# Patient Record
Sex: Male | Born: 1990
Health system: Southern US, Community
[De-identification: ages and names within clinical notes are randomized; demographics above are authoritative.]

## PROBLEM LIST (undated history)

## (undated) DIAGNOSIS — K529 Noninfective gastroenteritis and colitis, unspecified: Secondary | ICD-10-CM

## (undated) DIAGNOSIS — R569 Unspecified convulsions: Secondary | ICD-10-CM

## (undated) DIAGNOSIS — D649 Anemia, unspecified: Secondary | ICD-10-CM

## (undated) DIAGNOSIS — D126 Benign neoplasm of colon, unspecified: Secondary | ICD-10-CM

## (undated) DIAGNOSIS — R519 Headache, unspecified: Secondary | ICD-10-CM

## (undated) HISTORY — PX: HERNIA REPAIR: SHX51

## (undated) HISTORY — PX: TONSILLECTOMY: SUR1361

---

## 2017-06-06 HISTORY — PX: LIPOMA EXCISION: SHX5283

## 2018-05-14 ENCOUNTER — Other Ambulatory Visit: Payer: Self-pay

## 2018-05-14 ENCOUNTER — Emergency Department
Admission: EM | Admit: 2018-05-14 | Discharge: 2018-05-14 | Disposition: A | Discharge status: Home / Self Care | Payer: Medicaid - Out of State | Attending: Emergency Medicine | Admitting: Emergency Medicine

## 2018-05-14 ENCOUNTER — Encounter: Payer: Self-pay | Admitting: Emergency Medicine

## 2018-05-14 DIAGNOSIS — R0981 Nasal congestion: Secondary | ICD-10-CM | POA: Diagnosis present

## 2018-05-14 DIAGNOSIS — J0100 Acute maxillary sinusitis, unspecified: Secondary | ICD-10-CM | POA: Insufficient documentation

## 2018-05-14 DIAGNOSIS — R070 Pain in throat: Secondary | ICD-10-CM | POA: Insufficient documentation

## 2018-05-14 DIAGNOSIS — R569 Unspecified convulsions: Secondary | ICD-10-CM

## 2018-05-14 HISTORY — DX: Unspecified convulsions: R56.9

## 2018-05-14 LAB — COMPREHENSIVE METABOLIC PANEL
ALT: 20 U/L (ref 0–44)
AST: 20 U/L (ref 15–41)
Albumin: 4.8 g/dL (ref 3.5–5.0)
Alkaline Phosphatase: 61 U/L (ref 38–126)
Anion gap: 7 (ref 5–15)
BUN: 14 mg/dL (ref 6–20)
CO2: 28 mmol/L (ref 22–32)
Calcium: 9.2 mg/dL (ref 8.9–10.3)
Chloride: 103 mmol/L (ref 98–111)
Creatinine, Ser: 1.11 mg/dL (ref 0.61–1.24)
GFR calc Af Amer: >60 mL/min (ref >60)
GFR calc non Af Amer: >60 mL/min (ref >60)
Glucose, Bld: 103 mg/dL — ABNORMAL HIGH (ref 70–99)
Potassium: 4.1 mmol/L (ref 3.5–5.1)
Sodium: 138 mmol/L (ref 135–145)
Total Bilirubin: 0.4 mg/dL (ref 0.3–1.2)
Total Protein: 7.5 g/dL (ref 6.5–8.1)

## 2018-05-14 LAB — URINALYSIS, COMPLETE (UACMP) WITH MICROSCOPIC
Bacteria, UA: NONE SEEN
Bilirubin Urine: NEGATIVE
Glucose, UA: NEGATIVE mg/dL
Hgb urine dipstick: NEGATIVE
Ketones, ur: NEGATIVE mg/dL
Leukocytes, UA: NEGATIVE
Nitrite: NEGATIVE
Protein, ur: NEGATIVE mg/dL
Specific Gravity, Urine: 1.008 (ref 1.005–1.030)
Squamous Epithelial / HPF: NONE SEEN (ref 0–5)
pH: 7 (ref 5.0–8.0)

## 2018-05-14 LAB — CBC
HCT: 43.4 % (ref 39.0–52.0)
Hemoglobin: 14.6 g/dL (ref 13.0–17.0)
MCH: 30.3 pg (ref 26.0–34.0)
MCHC: 33.6 g/dL (ref 30.0–36.0)
MCV: 90 fL (ref 80.0–100.0)
Platelets: 227 10*3/uL (ref 150–400)
RBC: 4.82 MIL/uL (ref 4.22–5.81)
RDW: 12.3 % (ref 11.5–15.5)
WBC: 7.8 10*3/uL (ref 4.0–10.5)
nRBC: 0 % (ref 0.0–0.2)

## 2018-05-14 LAB — URINE DRUG SCREEN, QUALITATIVE (ARMC ONLY)
Amphetamines, Ur Screen: NOT DETECTED
Barbiturates, Ur Screen: NOT DETECTED
Benzodiazepine, Ur Scrn: NOT DETECTED
Cannabinoid 50 Ng, Ur ~~LOC~~: NOT DETECTED
Cocaine Metabolite,Ur ~~LOC~~: NOT DETECTED
MDMA (Ecstasy)Ur Screen: NOT DETECTED
Methadone Scn, Ur: NOT DETECTED
Opiate, Ur Screen: NOT DETECTED
Phencyclidine (PCP) Ur S: NOT DETECTED
Tricyclic, Ur Screen: NOT DETECTED

## 2018-05-14 LAB — ETHANOL: Alcohol, Ethyl (B): <10 mg/dL (ref <10)

## 2018-05-14 LAB — MAGNESIUM: Magnesium: 2.4 mg/dL (ref 1.7–2.4)

## 2018-05-14 MED ORDER — AMOXICILLIN-POT CLAVULANATE 875-125 MG PO TABS: 1.0000 | ORAL_TABLET | Freq: Two times a day (BID) | ORAL | 0 refills | Status: AC

## 2018-05-14 NOTE — Discharge Instructions (Signed)
Your exam is consistent with a sinus infection.  Please follow-up with neurology for reevaluation of pseudoseizures.  Please return to the emergency department for any new, change or increase in symptoms.

## 2018-05-14 NOTE — ED Provider Notes (Signed)
[facility] Emergency Department Provider Note  ____________________________________________  Time seen: Approximately 8:43 PM  I have reviewed the triage vital signs and the nursing notes.   HISTORY  Chief Complaint Nasal Congestion and URI    HPI Tony [name] is a 27 y.o. male that presents emergency department for evaluation of nasal congestion, sore throat for 1 week.  Patient states also while waiting in the room, he began to have a seizure.  He states that his whole body feels numb and both his arms are shaking.  He states that he was diagnosed 2 months ago with pseudoseizures.  He has had an MRI and a normal EEG.  His neurologist put him on carbamazepine and he self continued 1 month ago because it did not help.  He was evaluated in the emergency department several times for this in [place] and was given Ativan, with relief.  This feels the same as his normal episodes.  He estimates having about 3/week and states they last about 5 minutes.  He just moved here from [place] and does not yet have follow-up with a neurologist or primary care.  No fever, chills.  Past Medical History:  Diagnosis Date  . Seizures ([place])     There are no active problems to display for this patient.   Past Surgical History:  Procedure Laterality Date  . TONSILLECTOMY      Prior to Admission medications   Not on File    Allergies Patient has no known allergies.  History reviewed. No pertinent family history.  Social History Social History   Tobacco Use  . Smoking status: Never Smoker  . Smokeless tobacco: Never Used  Substance Use Topics  . Alcohol use: Not on file    Comment: rarely  . Drug use: Never     Review of Systems  Constitutional: No fever/chills Eyes: No visual changes. No discharge. ENT: Positive for congestion and rhinorrhea. Cardiovascular: No chest pain. Respiratory: Negative for cough. No SOB.  Gastrointestinal: No abdominal pain.  No  nausea, no vomiting.   Musculoskeletal: Negative for musculoskeletal pain. Skin: Negative for rash, abrasions, lacerations, ecchymosis. Neurological: Negative for headaches.   ____________________________________________   PHYSICAL EXAM:  VITAL SIGNS: ED Triage Vitals [05/14/18 1929]  Enc Vitals Group     BP 108/67     Pulse Rate 69     Resp 16     Temp 98.6 F (37 C)     Temp Source Oral     SpO2 98 %     Weight 200 lb (90.7 kg)     Height 5\' 10"  (1.778 m)     Head Circumference      Peak Flow      Pain Score 3     Pain Loc      Pain Edu?      Excl. in [place]?      Constitutional: Alert and oriented. Well appearing and in no acute distress. Eyes: Conjunctivae are normal. PERRL. EOMI. No discharge. Head: Atraumatic. ENT: No frontal and maxillary sinus tenderness.      Ears: Tympanic membranes pearly gray with good landmarks. No discharge.      Nose: Mild congestion/rhinnorhea.      Mouth/Throat: Mucous membranes are moist. Oropharynx non-erythematous. Tonsils not enlarged. No exudates. Uvula midline. Neck: No stridor.   Hematological/Lymphatic/Immunilogical: No cervical lymphadenopathy. Cardiovascular: Normal rate, regular rhythm.  Good peripheral circulation. Respiratory: Normal respiratory effort without tachypnea or retractions. Lungs CTAB. Good air entry to the bases  with no decreased or absent breath sounds. Gastrointestinal: Bowel sounds 4 quadrants. Soft and nontender to palpation. No guarding or rigidity. No palpable masses. No distention. Musculoskeletal: Full range of motion to all extremities. No gross deformities appreciated. Neurologic: Normal speech and language. No gross focal neurologic deficits are appreciated.   Strength 5/5 in upper and lower extremities Cerebellar: Finger-nose-finger slow, Heel to shin WNL Sensorimotor: No pronator drift, clonus, sensory loss or abnormal reflexes. No vision deficits noted to confrontation bilaterally.  Speech: No  dysarthria or expressive aphasia Skin:  Skin is warm, dry and intact. No rash noted. Psychiatric: Mood and affect are normal. Speech and behavior are normal. Patient exhibits appropriate insight and judgement.   ____________________________________________   LABS (all labs ordered are listed, but only abnormal results are displayed)  Labs Reviewed  CBC  COMPREHENSIVE METABOLIC PANEL  MAGNESIUM  ETHANOL  URINALYSIS, COMPLETE (UACMP) WITH MICROSCOPIC  URINE DRUG SCREEN, QUALITATIVE ([place])   ____________________________________________  EKG   ____________________________________________  RADIOLOGY   No results found.  ____________________________________________    PROCEDURES  Procedure(s) performed:    Procedures    Medications - No data to display   ____________________________________________   INITIAL IMPRESSION / ASSESSMENT AND PLAN / ED COURSE  Pertinent labs & imaging results that were available during my care of the patient were reviewed by me and considered in my medical decision making (see chart for details).  Review of the [place] CSRS was performed in accordance of the [place] prior to dispensing any controlled drugs.     Patient presented emergency department for evaluation of nasal congestion and concern for seizure-like activity while in the emergency department.  He was diagnosed in [place] with pseudoseizures 2 months ago and has not had follow-up with neurology or primary care here.  Initial neuro exam was remarkable for slow finger-to-nose.  Patient did have visible tremors to bilateral arms, which stopped when he was readjusting himself in bed.  He also felt subjectively numb to his entire body.  He estimated that this episode lasted 5 minutes.  Repeat neuro exam was normal.  Blood work is within normal limits.  Dr. Cherylann Banas came to see the patient and agrees with plan of discharge home and follow-up with neurology.  Patient appears well and  is staying well hydrated. Patient feels comfortable going home. Patient will be discharged home with prescriptions for Augmentin. Patient is to follow up with neurology and primary care as needed or otherwise directed. Patient is given ED precautions to return to the ED for any worsening or new symptoms.     ____________________________________________  FINAL CLINICAL IMPRESSION(S) / ED DIAGNOSES  Final diagnoses:  None      NEW MEDICATIONS STARTED DURING THIS VISIT:  ED Discharge Orders    None          This chart was dictated using voice recognition software/Dragon. Despite best efforts to proofread, errors can occur which can change the meaning. Any change was purely unintentional.    Laban Emperor, PA-C 05/15/18 0029    Arta Silence, MD 05/15/18 [phone]

## 2018-05-14 NOTE — ED Triage Notes (Signed)
Pt to ED from home c/o URI, sore throat over 1 week.  Pt appears in NAD at this time.

## 2018-06-28 ENCOUNTER — Emergency Department
Admission: EM | Admit: 2018-06-28 | Discharge: 2018-06-28 | Disposition: A | Discharge status: Home / Self Care | Payer: Medicaid - Out of State | Attending: Emergency Medicine | Admitting: Emergency Medicine

## 2018-06-28 DIAGNOSIS — I951 Orthostatic hypotension: Secondary | ICD-10-CM | POA: Diagnosis not present

## 2018-06-28 DIAGNOSIS — E860 Dehydration: Secondary | ICD-10-CM | POA: Insufficient documentation

## 2018-06-28 DIAGNOSIS — R42 Dizziness and giddiness: Secondary | ICD-10-CM

## 2018-06-28 DIAGNOSIS — R55 Syncope and collapse: Secondary | ICD-10-CM | POA: Diagnosis present

## 2018-06-28 LAB — CBC
HCT: 41.7 % (ref 39.0–52.0)
Hemoglobin: 14.1 g/dL (ref 13.0–17.0)
MCH: 29.8 pg (ref 26.0–34.0)
MCHC: 33.8 g/dL (ref 30.0–36.0)
MCV: 88.2 fL (ref 80.0–100.0)
Platelets: 222 10*3/uL (ref 150–400)
RBC: 4.73 MIL/uL (ref 4.22–5.81)
RDW: 12.3 % (ref 11.5–15.5)
WBC: 15.4 10*3/uL — ABNORMAL HIGH (ref 4.0–10.5)
nRBC: 0 % (ref 0.0–0.2)

## 2018-06-28 LAB — GLUCOSE, CAPILLARY: Glucose-Capillary: 120 mg/dL — ABNORMAL HIGH (ref 70–99)

## 2018-06-28 LAB — BASIC METABOLIC PANEL
Anion gap: 7 (ref 5–15)
BUN: 22 mg/dL — ABNORMAL HIGH (ref 6–20)
CO2: 26 mmol/L (ref 22–32)
Calcium: 9 mg/dL (ref 8.9–10.3)
Chloride: 105 mmol/L (ref 98–111)
Creatinine, Ser: 1.2 mg/dL (ref 0.61–1.24)
GFR calc Af Amer: >60 mL/min (ref >60)
GFR calc non Af Amer: >60 mL/min (ref >60)
Glucose, Bld: 95 mg/dL (ref 70–99)
Potassium: 4 mmol/L (ref 3.5–5.1)
Sodium: 138 mmol/L (ref 135–145)

## 2018-06-28 LAB — INFLUENZA PANEL BY PCR (TYPE A & B)
Influenza A By PCR: NEGATIVE
Influenza B By PCR: NEGATIVE

## 2018-06-28 MED ORDER — ONDANSETRON 4 MG PO TBDP: 4.0000 mg | ORAL_TABLET | Freq: Three times a day (TID) | ORAL | 0 refills | Status: DC | PRN

## 2018-06-28 NOTE — ED Provider Notes (Signed)
[facility] Emergency Department Provider Note  ____________________________________________  Time seen: Approximately 10:06 PM  I have reviewed the triage vital signs and the nursing notes.   HISTORY  Chief Complaint Loss of Consciousness    HPI Tony [name] is a 28 y.o. 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  He tried to hold it as long as he could but then when he felt like he really needed to go he got up suddenly got to go to the bathroom and quickly felt like his body went numb, vision went dark, and fell down.  Unsure if he lost consciousness.  He went to the bathroom, but on trying to walk back from the bathroom he fell 2 more times when he tried to stand up and walk.  Denies any chest pain shortness of breath headache vision change paresthesias or weakness.  No significant prodromal symptoms or current pain complaints.  He just feels tired.  Does note that he had not had anything to eat today although he had been drinking some water.  No recent illness.      Past Medical History:  Diagnosis Date  . Seizures ([place])      There are no active problems to display for this patient.    Past Surgical History:  Procedure Laterality Date  . TONSILLECTOMY       Prior to Admission medications   Medication Sig Start Date End Date Taking? Authorizing Provider  ondansetron (ZOFRAN ODT) 4 MG disintegrating tablet Take 1 tablet (4 mg total) by mouth every 8 (eight) hours as needed for nausea or vomiting. 06/28/18   Carrie Mew, MD     Allergies Patient has no known allergies.   No family history on file.  Social History Social History   Tobacco Use  . Smoking status: Never Smoker  . Smokeless tobacco: Never Used  Substance Use Topics  . Alcohol use: Not on file    Comment: rarely  . Drug use: Never    Review of Systems  Constitutional:   No fever  or chills.  ENT:   No sore throat. No rhinorrhea. Cardiovascular:   No chest pain or syncope. Respiratory:   No dyspnea or cough. Gastrointestinal:   Negative for abdominal pain, vomiting and diarrhea.  Musculoskeletal:   Negative for focal pain or swelling All other systems reviewed and are negative except as documented above in ROS and HPI.  ____________________________________________   PHYSICAL EXAM:  VITAL SIGNS: ED Triage Vitals [06/28/18 2024]  Enc Vitals Group     BP (!) 110/96     Pulse Rate 82     Resp 18     Temp 97.9 F (36.6 C)     Temp Source Oral     SpO2 97 %     Weight 200 lb (90.7 kg)     Height 5\' 10"  (1.778 m)     Head Circumference      Peak Flow      Pain Score 5     Pain Loc      Pain Edu?      Excl. in [place]?     Vital signs reviewed, nursing assessments reviewed.   Constitutional:   Alert and oriented. Non-toxic appearance. Eyes:   Conjunctivae are normal. EOMI. PERRL. ENT      Head:   Normocephalic and atraumatic.      Nose:  No congestion/rhinnorhea.       Mouth/Throat:   MMM, no pharyngeal erythema. No peritonsillar mass.       Neck:   No meningismus. Full ROM. Hematological/Lymphatic/Immunilogical:   No cervical lymphadenopathy. Cardiovascular:   RRR. Symmetric bilateral radial and DP pulses.  No murmurs. Cap refill less than 2 seconds.  Heart rate increases from 70-100 moving from supine to sitting.  1+ radial pulse sitting upright.  Normal capillary refill Respiratory:   Normal respiratory effort without tachypnea/retractions. Breath sounds are clear and equal bilaterally. No wheezes/rales/rhonchi. Gastrointestinal:   Soft and nontender. Non distended. There is no CVA tenderness.  No rebound, rigidity, or guarding.  Musculoskeletal:   Normal range of motion in all extremities. No joint effusions.  No lower extremity tenderness.  No edema. Neurologic:   Normal speech and language.  Motor grossly intact. Ambulatory. No acute focal  neurologic deficits are appreciated.  Skin:    Skin is warm, dry and intact. No rash noted.  No petechiae, purpura, or bullae.  ____________________________________________    LABS (pertinent positives/negatives) (all labs ordered are listed, but only abnormal results are displayed) Labs Reviewed  BASIC METABOLIC PANEL - Abnormal; Notable for the following components:      Result Value   BUN 22 (*)    All other components within normal limits  CBC - Abnormal; Notable for the following components:   WBC 15.4 (*)    All other components within normal limits  GLUCOSE, CAPILLARY - Abnormal; Notable for the following components:   Glucose-Capillary 120 (*)    All other components within normal limits  INFLUENZA PANEL BY PCR (TYPE A & B)  URINALYSIS, COMPLETE (UACMP) WITH MICROSCOPIC  CBG MONITORING, ED   ____________________________________________   EKG  Interpreted by me  Date: 06/28/2018  Rate: 83  Rhythm: normal sinus rhythm  QRS Axis: normal  Intervals: normal  ST/T Wave abnormalities: normal  Conduction Disutrbances: none  Narrative Interpretation: unremarkable      ____________________________________________    RADIOLOGY  No results found.  ____________________________________________   PROCEDURES Procedures  ____________________________________________    CLINICAL IMPRESSION / ASSESSMENT AND PLAN / ED COURSE  Pertinent labs & imaging results that were available during my care of the patient were reviewed by me and considered in my medical decision making (see chart for details).    Patient presents with orthostatic symptoms, recurrent falls which appear to be due to orthostatic hypotension.  On exam he has a significant increase in heart rate from supine to sitting.  Work-up unremarkable, doubt cardiovascular cause.  Recommend aggressive hydration with carbohydrate salt and water intake.  Follow-up with primary care within a week.  Follow-up  information given for GI as well as he does report a strong family history of IBD, although he currently does not have symptoms such as chronic diarrhea abdominal pain bloody stool or weight loss.      ____________________________________________   FINAL CLINICAL IMPRESSION(S) / ED DIAGNOSES    Final diagnoses:  Orthostatic dizziness  Dehydration     ED Discharge Orders         Ordered    ondansetron (ZOFRAN ODT) 4 MG disintegrating tablet  Every 8 hours PRN     06/28/18 2205          Portions of this note were generated with dragon dictation software. Dictation errors may occur despite best attempts at proofreading.   Carrie Mew, MD 06/28/18 2210

## 2018-06-28 NOTE — ED Triage Notes (Signed)
Patient reports LOC earlier today. Patient reports hx of seizure-like activity, seeing neurology for. Patient denies any other symptoms.

## 2018-06-28 NOTE — ED Notes (Signed)
Pt signed printed d/c paperwork.  

## 2018-06-28 NOTE — ED Notes (Signed)
Pt denies hitting head when falling down but makes statement of HA. Pupils round, equal, reactive to light.

## 2018-06-28 NOTE — Discharge Instructions (Signed)
Your lab tests today were okay, and your flu test was negative.

## 2018-07-26 ENCOUNTER — Ambulatory Visit (INDEPENDENT_AMBULATORY_CARE_PROVIDER_SITE_OTHER)
Admit: 2018-07-26 | Discharge: 2018-07-26 | Disposition: A | Discharge status: Home / Self Care | Payer: Worker's Compensation | Attending: Emergency Medicine | Admitting: Emergency Medicine

## 2018-07-26 ENCOUNTER — Other Ambulatory Visit: Payer: Self-pay

## 2018-07-26 ENCOUNTER — Ambulatory Visit
Admission: EM | Admit: 2018-07-26 | Discharge: 2018-07-26 | Disposition: A | Discharge status: Home / Self Care | Payer: Self-pay | Attending: Emergency Medicine | Admitting: Emergency Medicine

## 2018-07-26 DIAGNOSIS — R51 Headache: Secondary | ICD-10-CM

## 2018-07-26 DIAGNOSIS — S0990XA Unspecified injury of head, initial encounter: Secondary | ICD-10-CM

## 2018-07-26 DIAGNOSIS — H6122 Impacted cerumen, left ear: Secondary | ICD-10-CM

## 2018-07-26 DIAGNOSIS — S060X0A Concussion without loss of consciousness, initial encounter: Secondary | ICD-10-CM

## 2018-07-26 NOTE — Discharge Instructions (Addendum)
You need to follow-up with occupational health at [facility].  You need to follow-up within a day or 2.  You may need to be referred to a concussion clinic.  You may take 1 g of Tylenol 3 or 4 times a day as needed for pain.  Go Immediately to the ER if you get worse, vomiting, visual loss, or for any concerns.

## 2018-07-26 NOTE — ED Provider Notes (Addendum)
HPI  SUBJECTIVE:  Tony [name] is a 28 y.o. male who presents with left-sided head and facial pain, headache, constantly blurry vision after being hit in the left temple with a metal bar while changing a tire yesterday while at work.  Patient states that the bar popped and hit him in the side of the head.  He reports nausea, and states that "everything is blurry".  He describes the head and facial pain as constant, dull, becoming sharp with movement, opening jaw and palpation.  He reports mild photophobia, cognitive slowing, difficulty concentrating and states that he slept poorly last night due to the pain.  No nasal congestion, rhinorrhea, eye trauma, eye pain.  No emotional lability.  He also reports an episode where his "body went numb" for about 10 to 15 minutes followed by 1 to 2 hours of shaking.  Denies loss of consciousness, falling, biting his tongue, urinary, fecal incontinence.  Tried ibuprofen with improvement in his headache/facial pain and rest.  Symptoms are worse with moving his head, opening his jaw, palpation.  He denies loss of consciousness, retrograde or anterograde amnesia, vomiting, neck stiffness, slurred speech, arm or leg weakness, facial droop.  No visual loss.  No eye trauma.  He has a past medical history of multiple concussions, he has never sought medical evaluation for any of them.  He estimates over 10 concussions.  He is not on any anticoagulants or antiplatelets.  No history of diabetes, hypertension.  History of anxiety, and has a negative work-up in the past for seizures.  PMD: None.  This is a workers comp case.    Past Medical History:  Diagnosis Date  . Seizures ([place])     Past Surgical History:  Procedure Laterality Date  . TONSILLECTOMY      History reviewed. No pertinent family history.  Social History   Tobacco Use  . Smoking status: Never Smoker  . Smokeless tobacco: Never Used  Substance Use Topics  . Alcohol use: Yes    Comment: rarely  .  Drug use: Never    No current facility-administered medications for this encounter.   Current Outpatient Medications:  .  ondansetron (ZOFRAN ODT) 4 MG disintegrating tablet, Take 1 tablet (4 mg total) by mouth every 8 (eight) hours as needed for nausea or vomiting., Disp: 20 tablet, Rfl: 0  No Known Allergies   ROS  As noted in HPI.   Physical Exam  BP (!) 124/96 (BP Location: Left Arm)   Pulse 61   Temp 97.6 F (36.4 C) (Oral)   Resp 18   Ht 5\' 10"  (1.778 m)   Wt 93 kg   SpO2 100%   BMI 29.41 kg/m   Constitutional: Well developed, well nourished, no acute distress Eyes: PERRLA, EOMI, conjunctiva normal bilaterally. Mild direct and consensual photophobia.  No corneal abrasion seen on fluorescein exam.  No hyphema.  Visual fields intact to confrontation Visual acuity: 20/40 right eye, 20/25 left eye.  20/25 bilaterally. HENT: Normocephalic, atraumatic.  tenderness superior left orbit and along the left temple.  No crepitus, bruising.  Skin intact.  TMs obscured with cerumen bilaterally.  No dental trauma.  No tenderness over the zygoma or the rest of the left face.  No tenderness, crepitus at the TMJ. Post irrigation of the left ear, no hemotympanum. Neck: Positive left-sided trapezial tenderness.  No appreciable muscle spasm.  No C-spine tenderness. Respiratory: Normal inspiratory effort Cardiovascular: Normal rate GI: nondistended skin: No rash, skin intact Musculoskeletal: no deformities Neurologic:  Alert & oriented, finger-nose within normal limits.  Tandem gait steady.  Romberg negative.  Cranial nerves III through XII intact.  Poor concentration.  Short-term recall 1 out of 3 items. Psychiatric: Speech and behavior appropriate   ED Course   Medications - No data to display  Orders Placed This Encounter  Procedures  . CT Head Wo Contrast    Standing Status:   Standing    Number of Occurrences:   1  . Ear wax removal    left    Standing Status:   Standing     Number of Occurrences:   1  . Visual acuity screening    Standing Status:   Standing    Number of Occurrences:   1    No results found for this or any previous visit (from the past 24 hour(s)). Ct Head Wo Contrast  Result Date: 07/26/2018 CLINICAL DATA:  28 y/o M; left-sided head injury at work yesterday. Visual changes and dizziness. EXAM: CT HEAD WITHOUT CONTRAST TECHNIQUE: Contiguous axial images were obtained from the base of the skull through the vertex without intravenous contrast. COMPARISON:  None. FINDINGS: Brain: No evidence of acute infarction, hemorrhage, hydrocephalus, extra-axial collection or mass lesion/mass effect. Vascular: No hyperdense vessel or unexpected calcification. Skull: Normal. Negative for fracture or focal lesion. Sinuses/Orbits: Moderate ethmoid air cell mucosal thickening and opacification of the visible right maxillary sinus. Normal aeration of mastoid air cells. Debris within the right external auditory canal, likely cerumen. Orbits are unremarkable. Other: Small occipital region dermal nodules, likely dermal appendage cyst. IMPRESSION: 1. No acute intracranial abnormality or calvarial fracture. Unremarkable CT of the brain. 2. Paranasal sinus disease. Electronically Signed   By: Kristine Garbe M.D.   On: 07/26/2018 14:09    ED Clinical Impression  Concussion without loss of consciousness, initial encounter  Minor head injury, initial encounter  Impacted cerumen of left ear   ED Assessment/Plan  Patient presents with a concussion, posttraumatic headache.  Will CT head to rule out any intracranial abnormalities, orbital fracture.  No evidence of entrapment.  Will re-evaluate  Attempted to curette ear, unable to visualize TM.  We will have this irrigated and reevaluate.  Reviewed imaging independently.  No acute intracranial abnormality or calvarial fracture.  See radiology report for full details.  Will send home with Tylenol 1 g 4 times a day.   Follow-up with [facility] health employee health and wellness center at [facility] for reevaluation and possible referral to the concussion clinic. Will keep him out of work for today and tomorrow.  He is to follow-up with occupational health tomorrow.  He is to go to ER if he gets worse.  Discussed imaging, MDM, treatment plan, and plan for follow-up with patient. Discussed sn/sx that should prompt return to the ED. patient agrees with plan.   No orders of the defined types were placed in this encounter.   *This clinic note was created using Dragon dictation software. Therefore, there may be occasional mistakes despite careful proofreading.   ?   Melynda Ripple, MD 07/26/18 [place], Nickey Kloepfer, MD 07/26/18 [phone]

## 2018-07-26 NOTE — ED Triage Notes (Signed)
Patient states that yesterday at work he was changing a tire at work and a metal bar came across and hit him in the head. Patient states that he went home yesterday evening and thought everything was okay. Patient states that he has noticed some visual changes today and felt dizzy this morning. Patient concerned he may have a concussion.

## 2019-02-08 ENCOUNTER — Ambulatory Visit: Payer: 59 | Admitting: Internal Medicine

## 2019-02-08 ENCOUNTER — Other Ambulatory Visit: Payer: Self-pay

## 2019-02-08 ENCOUNTER — Encounter: Payer: Self-pay | Admitting: Internal Medicine

## 2019-02-08 VITALS — BP 108/78 | HR 79 | Ht 70.0 in | Wt 213.0 lb

## 2019-02-08 DIAGNOSIS — R569 Unspecified convulsions: Secondary | ICD-10-CM

## 2019-02-08 DIAGNOSIS — M62830 Muscle spasm of back: Secondary | ICD-10-CM | POA: Diagnosis not present

## 2019-02-08 DIAGNOSIS — K529 Noninfective gastroenteritis and colitis, unspecified: Secondary | ICD-10-CM | POA: Insufficient documentation

## 2019-02-08 DIAGNOSIS — F3289 Other specified depressive episodes: Secondary | ICD-10-CM

## 2019-02-08 DIAGNOSIS — R1084 Generalized abdominal pain: Secondary | ICD-10-CM

## 2019-02-08 DIAGNOSIS — F329 Major depressive disorder, single episode, unspecified: Secondary | ICD-10-CM | POA: Insufficient documentation

## 2019-02-08 DIAGNOSIS — F32A Depression, unspecified: Secondary | ICD-10-CM | POA: Insufficient documentation

## 2019-02-08 MED ORDER — METHOCARBAMOL 500 MG PO TABS: 500.0000 mg | ORAL_TABLET | Freq: Four times a day (QID) | ORAL | 1 refills | Status: DC

## 2019-02-08 NOTE — Progress Notes (Signed)
Date:  02/08/2019   Name:  Tony [name]   DOB:  1991/02/16   MRN:  [id number]   Chief Complaint: Establish Care (Is in the [facility], has not had pcp in a while. ), Depression (PHQ9- 11 GAD7- 11 ), Back Pain (Back pain x1 month- lower back. Wears a back brace from [employer]- helps but still painful. 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 X 1-2 years. Sometimes dark black stools. )  Depression        This is a chronic problem.  Episode onset: many years ago. The problem is unchanged.  Associated symptoms include decreased concentration, fatigue, insomnia and decreased interest.  Associated symptoms include not irritable, no myalgias, no headaches, not sad and no suicidal ideas.     The symptoms are aggravated by nothing.  Treatments tried: he has been on medications (unknown names) briefly in the past.  Compliance with treatment is poor.  Past compliance problems: did not feel that medications helped. Back Pain This is a new problem. The current episode started 1 to 4 weeks ago (started at work lifting heavy tires ). The problem occurs daily. The pain is present in the lumbar spine. The pain is mild. The symptoms are aggravated by standing and twisting. Associated symptoms include abdominal pain and weakness (left leg buckled under him recently). Pertinent negatives include no bowel incontinence, chest pain, headaches, numbness, paresthesias, perianal numbness, tingling or weight loss. Treatments tried: tylenol. The treatment provided mild relief.  Abdominal Pain This is a recurrent problem. The current episode started more than 1 year ago. The problem occurs daily. The problem has been unchanged. The pain is mild. The quality of the pain is a sensation of fullness. The abdominal pain radiates to the back. Associated symptoms include nausea. Pertinent negatives include no headaches, hematuria, myalgias, vomiting or weight loss. The  pain is aggravated by eating. He has tried antacids for the symptoms. The treatment provided mild relief.  Seizure like activity - he has had this for some time.  He was seen last year by Neurology, EMG was normal. Etiology of these episodes was never determined.  He was placed on medications with no benefit so he stopped them on his own. They seem to be brought on by stress.  He does not know the name of the neurologist that he saw in [place].  Review of Systems  Constitutional: Positive for fatigue. Negative for chills, diaphoresis, unexpected weight change and weight loss.  HENT: Negative for trouble swallowing.   Respiratory: Positive for shortness of breath (he feels like it is harder to breath here in [place].). Negative for chest tightness, wheezing and stridor.   Cardiovascular: Negative for chest pain, palpitations and leg swelling.  Gastrointestinal: Positive for abdominal pain and nausea. Negative for bowel incontinence and vomiting.  Genitourinary: Negative for difficulty urinating, hematuria and urgency.  Musculoskeletal: Positive for back pain. Negative for gait problem, joint swelling and myalgias.  Skin: Negative for rash.  Neurological: Positive for weakness (left leg buckled under him recently). Negative for dizziness, tingling, numbness, headaches and paresthesias.  Hematological: Negative for adenopathy. Does not bruise/bleed easily.  Psychiatric/Behavioral: Positive for decreased concentration, depression, dysphoric mood and sleep disturbance. Negative for suicidal ideas. The patient has insomnia. The patient is not nervous/anxious.     There are no active problems to display for this patient.   No Known Allergies  Past Surgical History:  Procedure Laterality Date  . LIPOMA  EXCISION  2019   lower right back  . TONSILLECTOMY      Social History   Tobacco Use  . Smoking status: Never Smoker  . Smokeless tobacco: Never Used  Substance Use Topics  . Alcohol use: Yes     Comment: rarely  . Drug use: Never     Medication list has been reviewed and updated.  No outpatient medications have been marked as taking for the 02/08/19 encounter (Office Visit) with Glean Hess, MD.    [facility] 2/9 Scores 02/08/2019  PHQ - 2 Score 1  PHQ- 9 Score 11    BP Readings from Last 3 Encounters:  02/08/19 108/78  07/26/18 (!) 124/96  06/28/18 112/71    Physical Exam Vitals signs and nursing note reviewed.  Constitutional:      General: He is not irritable.He is not in acute distress.    Appearance: He is well-developed.  HENT:     Head: Normocephalic and atraumatic.  Cardiovascular:     Rate and Rhythm: Normal rate and regular rhythm.     Heart sounds: Normal heart sounds. No murmur.  Pulmonary:     Effort: Pulmonary effort is normal. No respiratory distress.  Abdominal:     General: Abdomen is flat. Bowel sounds are normal. There is no distension.     Palpations: Abdomen is soft. There is no hepatomegaly or splenomegaly.     Tenderness: There is no abdominal tenderness. There is no guarding or rebound.  Musculoskeletal: Normal range of motion.     Lumbar back: He exhibits tenderness and spasm.  Skin:    General: Skin is warm and dry.     Capillary Refill: Capillary refill takes less than 2 seconds.     Findings: No rash.  Neurological:     Mental Status: He is alert and oriented to person, place, and time.     Sensory: Sensation is intact.     Motor: Motor function is intact.     Deep Tendon Reflexes:     Reflex Scores:      Patellar reflexes are 2+ on the right side and 2+ on the left side.      Achilles reflexes are 3+ on the right side and 3+ on the left side.    Comments: SLR mildly positive on the right  Psychiatric:        Attention and Perception: Attention and perception normal.        Mood and Affect: Mood normal.        Behavior: Behavior normal.        Thought Content: Thought content normal. Thought content does not include suicidal  ideation. Thought content does not include suicidal plan.        Cognition and Memory: Cognition normal.        Judgment: Judgment normal.     Wt Readings from Last 3 Encounters:  02/08/19 213 lb (96.6 kg)  07/26/18 205 lb (93 kg)  06/28/18 200 lb (90.7 kg)    BP 108/78   Pulse 79   Ht 5\' 10"  (1.778 m)   Wt 213 lb (96.6 kg)   SpO2 98%   BMI 30.56 kg/m   Assessment and Plan: 1. Seizure-like activity ([place]) Partial workup and treatment in [place] Needs to establish with Neurology here - Ambulatory referral to Neurology  2. Generalized abdominal pain No red flags signs but with family hx Crohn's he needs GI evaluation - Ambulatory referral to Gastroenterology  3. Back muscle spasm Continue  tylenol qid Use heat or ice Take robaxin when not driving or working - mainly at [employer] if no improvement - methocarbamol (ROBAXIN) 500 MG tablet; Take 1 tablet (500 mg total) by mouth 4 (four) times daily.  Dispense: 40 tablet; Refill: 1  4. Other depression Pt has long standing depression and PTSD by his report He is not interested in medication or psych referral at this time He can return here as needed Will plan for routine follow up in 4 months   Partially dictated using [place]. Any errors are unintentional.  Halina Maidens, MD [facility]  02/08/2019

## 2019-02-08 NOTE — Patient Instructions (Signed)
Continue Tylenol as needed for back pain  Use ice or heat your back

## 2019-02-26 ENCOUNTER — Ambulatory Visit (INDEPENDENT_AMBULATORY_CARE_PROVIDER_SITE_OTHER): Payer: 59 | Admitting: Gastroenterology

## 2019-02-26 ENCOUNTER — Encounter: Payer: Self-pay | Admitting: Gastroenterology

## 2019-02-26 ENCOUNTER — Other Ambulatory Visit: Payer: Self-pay

## 2019-02-26 VITALS — BP 98/61 | HR 58 | Temp 97.4°F | Ht 70.0 in | Wt 212.0 lb

## 2019-02-26 DIAGNOSIS — R194 Change in bowel habit: Secondary | ICD-10-CM

## 2019-02-26 DIAGNOSIS — R1084 Generalized abdominal pain: Secondary | ICD-10-CM

## 2019-02-26 MED ORDER — PEG 3350-KCL-NA BICARB-NACL 420 G PO SOLR: ORAL | 0 refills | Status: DC

## 2019-02-26 NOTE — Progress Notes (Signed)
Gastroenterology Consultation  Referring Provider:     Glean Hess, MD Primary Care Physician:  Tony Hess, MD Primary Gastroenterologist:  Dr. Allen [name]     Reason for Consultation:     Abdominal pain        HPI:   Tony [name] is a 28 y.o. y/o male referred for consultation & management of abdominal pain by Dr. Army [name], Tony Sans, MD.  This patient comes in today with a history of abdominal pain for the last 1 to 2 years.  The patient also reports that he has been having increased bowel movements for some time.  He reports that he has 3 soft bowel movements a day.  The patient's mother suffers from Crohn's disease and the patient states that he has chronic lower abdominal pain.  There is no report of any black stools or bloody stools.  He also denies any unexplained weight loss.  The patient does report that he does have dry eyes and joint pains.  He reports his joint pain to be an all joints including his hands feet and ankle.  He says he has shortness of breath because he moved from [place] and he says that the change in the altitude has caused him to have some trouble breathing.  The patient has not been diagnosed with inflammatory bowel disease in the past and he states that he has not followed up with any gastroenterologist to this abdominal pain in the past.  His symptoms are not made better or worse with eating or moving but he does report that he feels better after moving his bowels.  Past Medical History:  Diagnosis Date  . Seizures ([place])     Past Surgical History:  Procedure Laterality Date  . LIPOMA EXCISION  2019   lower right back  . TONSILLECTOMY      Prior to Admission medications   Medication Sig Start Date End Date Taking? Authorizing Provider  methocarbamol (ROBAXIN) 500 MG tablet Take 1 tablet (500 mg total) by mouth 4 (four) times daily. 02/08/19  Yes Tony Hess, MD  polyethylene glycol-electrolytes (NULYTELY/GOLYTELY) 420 g solution Drink one 8 oz  glass every 20 mins until entire container if finished starting at 5:00pm on 03/07/19 02/26/19   Tony Lame, MD    Family History  Problem Relation Age of Onset  . Crohn's disease Mother      Social History   Tobacco Use  . Smoking status: Never Smoker  . Smokeless tobacco: Never Used  Substance Use Topics  . Alcohol use: Yes    Comment: rarely  . Drug use: Never    Allergies as of 02/26/2019  . (No Known Allergies)    Review of Systems:    All systems reviewed and negative except where noted in HPI.   Physical Exam:  BP 98/61   Pulse (!) 58   Temp (!) 97.4 F (36.3 C) (Oral)   Ht 5\' 10"  (1.778 m)   Wt 212 lb (96.2 kg)   BMI 30.42 kg/m  No LMP for male patient. General:   Alert,  Well-developed, well-nourished, pleasant and cooperative in NAD Head:  Normocephalic and atraumatic. Eyes:  Sclera clear, no icterus.   Conjunctiva pink. Ears:  Normal auditory acuity. Nose:  No deformity, discharge, or lesions. Mouth:  No deformity or lesions,oropharynx pink & moist. Neck:  Supple; no masses or thyromegaly. Lungs:  Respirations even and unlabored.  Clear throughout to auscultation.   No wheezes, crackles, or rhonchi. No  acute distress. Heart:  Regular rate and rhythm; no murmurs, clicks, rubs, or gallops. Abdomen:  Normal bowel sounds.  No bruits.  Soft, diffuse abdominal tenderness and non-distended without masses, hepatosplenomegaly or hernias noted.  No guarding or rebound tenderness.  Negative Carnett sign.   Rectal:  Deferred.  Msk:  Symmetrical without gross deformities.  Good, equal movement & strength bilaterally. Pulses:  Normal pulses noted. Extremities:  No clubbing or edema.  No cyanosis. Neurologic:  Alert and oriented x3;  grossly normal neurologically. Skin:  Intact without significant lesions or rashes.  No jaundice. Lymph Nodes:  No significant cervical adenopathy. Psych:  Alert and cooperative. Normal mood and affect.  Imaging Studies: No results  found.  Assessment and Plan:   Tony [name] is a 28 y.o. y/o male who comes in today with diffuse abdominal pain which he reports to be more in the lower abdomen with an increase in his bowel movements now numbering 3 a day when he used to move his bowels only once a day.  The patient's stools have also been softer.  The patient has a family history of Crohn's disease and he will be set up for colonoscopy to assess him for any possible signs of any inflammatory bowel disease.  The patient has also been told that his symptoms can be related to his irritable bowel syndrome but IBD needs to be ruled out first. I have discussed risks & benefits which include, but are not limited to, bleeding, infection, perforation & drug reaction.  The patient agrees with this plan & written consent will be obtained.     Tony Lame, MD. [name]    Note: This dictation was prepared with Dragon dictation along with smaller phrase technology. Any transcriptional errors that result from this process are unintentional.

## 2019-02-26 NOTE — H&P (View-Only) (Signed)
Gastroenterology Consultation  Referring Provider:     Glean Hess, MD Primary Care Physician:  Glean Hess, MD Primary Gastroenterologist:  Dr. Allen Norris     Reason for Consultation:     Abdominal pain        HPI:   Tony [name] is a 28 y.o. y/o male referred for consultation & management of abdominal pain by Dr. Army Melia, [name], MD.  This patient comes in today with a history of abdominal pain for the last 1 to 2 years.  The patient also reports that he has been having increased bowel movements for some time.  He reports that he has 3 soft bowel movements a day.  The patient's mother suffers from Crohn's disease and the patient states that he has chronic lower abdominal pain.  There is no report of any black stools or bloody stools.  He also denies any unexplained weight loss.  The patient does report that he does have dry eyes and joint pains.  He reports his joint pain to be an all joints including his hands feet and ankle.  He says he has shortness of breath because he moved from [place] and he says that the change in the altitude has caused him to have some trouble breathing.  The patient has not been diagnosed with inflammatory bowel disease in the past and he states that he has not followed up with any gastroenterologist to this abdominal pain in the past.  His symptoms are not made better or worse with eating or moving but he does report that he feels better after moving his bowels.  Past Medical History:  Diagnosis Date  . Seizures ([place])     Past Surgical History:  Procedure Laterality Date  . LIPOMA EXCISION  2019   lower right back  . TONSILLECTOMY      Prior to Admission medications   Medication Sig Start Date End Date Taking? Authorizing Provider  methocarbamol (ROBAXIN) 500 MG tablet Take 1 tablet (500 mg total) by mouth 4 (four) times daily. 02/08/19  Yes Glean Hess, MD  polyethylene glycol-electrolytes (NULYTELY/GOLYTELY) 420 g solution Drink one 8 oz  glass every 20 mins until entire container if finished starting at 5:00pm on 03/07/19 02/26/19   Lucilla Lame, MD    Family History  Problem Relation Age of Onset  . Crohn's disease Mother      Social History   Tobacco Use  . Smoking status: Never Smoker  . Smokeless tobacco: Never Used  Substance Use Topics  . Alcohol use: Yes    Comment: rarely  . Drug use: Never    Allergies as of 02/26/2019  . (No Known Allergies)    Review of Systems:    All systems reviewed and negative except where noted in HPI.   Physical Exam:  BP 98/61   Pulse (!) 58   Temp (!) 97.4 F (36.3 C) (Oral)   Ht 5\' 10"  (1.778 m)   Wt 212 lb (96.2 kg)   BMI 30.42 kg/m  No LMP for male patient. General:   Alert,  Well-developed, well-nourished, pleasant and cooperative in NAD Head:  Normocephalic and atraumatic. Eyes:  Sclera clear, no icterus.   Conjunctiva pink. Ears:  Normal auditory acuity. Nose:  No deformity, discharge, or lesions. Mouth:  No deformity or lesions,oropharynx pink & moist. Neck:  Supple; no masses or thyromegaly. Lungs:  Respirations even and unlabored.  Clear throughout to auscultation.   No wheezes, crackles, or rhonchi. No  acute distress. Heart:  Regular rate and rhythm; no murmurs, clicks, rubs, or gallops. Abdomen:  Normal bowel sounds.  No bruits.  Soft, diffuse abdominal tenderness and non-distended without masses, hepatosplenomegaly or hernias noted.  No guarding or rebound tenderness.  Negative Carnett sign.   Rectal:  Deferred.  Msk:  Symmetrical without gross deformities.  Good, equal movement & strength bilaterally. Pulses:  Normal pulses noted. Extremities:  No clubbing or edema.  No cyanosis. Neurologic:  Alert and oriented x3;  grossly normal neurologically. Skin:  Intact without significant lesions or rashes.  No jaundice. Lymph Nodes:  No significant cervical adenopathy. Psych:  Alert and cooperative. Normal mood and affect.  Imaging Studies: No results  found.  Assessment and Plan:   Tony [name] is a 28 y.o. y/o male who comes in today with diffuse abdominal pain which he reports to be more in the lower abdomen with an increase in his bowel movements now numbering 3 a day when he used to move his bowels only once a day.  The patient's stools have also been softer.  The patient has a family history of Crohn's disease and he will be set up for colonoscopy to assess him for any possible signs of any inflammatory bowel disease.  The patient has also been told that his symptoms can be related to his irritable bowel syndrome but IBD needs to be ruled out first. I have discussed risks & benefits which include, but are not limited to, bleeding, infection, perforation & drug reaction.  The patient agrees with this plan & written consent will be obtained.     Lucilla Lame, MD. [name]    Note: This dictation was prepared with Dragon dictation along with smaller phrase technology. Any transcriptional errors that result from this process are unintentional.

## 2019-03-05 ENCOUNTER — Encounter: Payer: Self-pay | Admitting: *Deleted

## 2019-03-05 ENCOUNTER — Other Ambulatory Visit: Payer: Self-pay

## 2019-03-05 ENCOUNTER — Ambulatory Visit: Payer: Self-pay | Admitting: Gastroenterology

## 2019-03-05 ENCOUNTER — Other Ambulatory Visit
Admission: RE | Admit: 2019-03-05 | Discharge: 2019-03-05 | Disposition: A | Discharge status: Home / Self Care | Payer: 59 | Source: Ambulatory Visit | Attending: Gastroenterology | Admitting: Gastroenterology

## 2019-03-05 DIAGNOSIS — Z01812 Encounter for preprocedural laboratory examination: Secondary | ICD-10-CM | POA: Insufficient documentation

## 2019-03-05 DIAGNOSIS — R1084 Generalized abdominal pain: Secondary | ICD-10-CM

## 2019-03-05 DIAGNOSIS — Z20828 Contact with and (suspected) exposure to other viral communicable diseases: Secondary | ICD-10-CM | POA: Insufficient documentation

## 2019-03-05 DIAGNOSIS — R194 Change in bowel habit: Secondary | ICD-10-CM

## 2019-03-05 LAB — SARS CORONAVIRUS 2 (TAT 6-24 HRS): SARS Coronavirus 2: NEGATIVE

## 2019-03-06 NOTE — Anesthesia Preprocedure Evaluation (Addendum)
Anesthesia Evaluation  Patient identified by MRN, date of birth, ID band Patient awake    Reviewed: Allergy & Precautions, NPO status , Patient's Chart, lab work & pertinent test results  History of Anesthesia Complications Negative for: history of anesthetic complications  Airway Mallampati: II   Neck ROM: Full    Dental  (+)    Pulmonary former smoker (quit 2018),    Pulmonary exam normal breath sounds clear to auscultation       Cardiovascular Exercise Tolerance: Good negative cardio ROS Normal cardiovascular exam Rhythm:Regular Rate:Normal  ECG 06/29/18: normal   Neuro/Psych  Headaches, Seizures -,  PSYCHIATRIC DISORDERS (PTSD) Depression    GI/Hepatic negative GI ROS,   Endo/Other  negative endocrine ROS  Renal/GU negative Renal ROS     Musculoskeletal   Abdominal   Peds  Hematology negative hematology ROS (+)   Anesthesia Other Findings   Reproductive/Obstetrics                            Anesthesia Physical Anesthesia Plan  ASA: II  Anesthesia Plan: General   Post-op Pain Management:    Induction: Intravenous  PONV Risk Score and Plan: 2 and Propofol infusion and TIVA  Airway Management Planned: Natural Airway  Additional Equipment:   Intra-op Plan:   Post-operative Plan:   Informed Consent: I have reviewed the patients History and Physical, chart, labs and discussed the procedure including the risks, benefits and alternatives for the proposed anesthesia with the patient or authorized representative who has indicated his/her understanding and acceptance.       Plan Discussed with: CRNA  Anesthesia Plan Comments:        Anesthesia Quick Evaluation

## 2019-03-07 NOTE — Discharge Instructions (Signed)

## 2019-03-08 ENCOUNTER — Other Ambulatory Visit: Payer: Self-pay

## 2019-03-08 ENCOUNTER — Ambulatory Visit: Payer: No Typology Code available for payment source | Admitting: Anesthesiology

## 2019-03-08 ENCOUNTER — Encounter
Admission: RE | Disposition: A | Discharge status: Home / Self Care | Payer: Self-pay | Source: Home / Self Care | Attending: Gastroenterology

## 2019-03-08 ENCOUNTER — Ambulatory Visit
Admission: RE | Admit: 2019-03-08 | Discharge: 2019-03-08 | Disposition: A | Discharge status: Home / Self Care | Payer: No Typology Code available for payment source | Attending: Gastroenterology | Admitting: Gastroenterology

## 2019-03-08 DIAGNOSIS — Z8379 Family history of other diseases of the digestive system: Secondary | ICD-10-CM | POA: Insufficient documentation

## 2019-03-08 DIAGNOSIS — R194 Change in bowel habit: Secondary | ICD-10-CM | POA: Diagnosis present

## 2019-03-08 DIAGNOSIS — R198 Other specified symptoms and signs involving the digestive system and abdomen: Secondary | ICD-10-CM | POA: Diagnosis not present

## 2019-03-08 DIAGNOSIS — Z79899 Other long term (current) drug therapy: Secondary | ICD-10-CM | POA: Diagnosis not present

## 2019-03-08 DIAGNOSIS — K529 Noninfective gastroenteritis and colitis, unspecified: Secondary | ICD-10-CM | POA: Insufficient documentation

## 2019-03-08 DIAGNOSIS — Z87891 Personal history of nicotine dependence: Secondary | ICD-10-CM | POA: Insufficient documentation

## 2019-03-08 DIAGNOSIS — D122 Benign neoplasm of ascending colon: Secondary | ICD-10-CM | POA: Diagnosis not present

## 2019-03-08 HISTORY — PX: COLONOSCOPY WITH PROPOFOL: SHX5780

## 2019-03-08 HISTORY — DX: Headache, unspecified: R51.9

## 2019-03-08 HISTORY — PX: POLYPECTOMY: SHX5525

## 2019-03-08 SURGERY — COLONOSCOPY WITH PROPOFOL
Anesthesia: General | Site: Rectum

## 2019-03-08 MED ORDER — PROPOFOL 10 MG/ML IV BOLUS
INTRAVENOUS | Status: DC | PRN
  Administered 2019-03-08 (×2): 50 mg via INTRAVENOUS
  Administered 2019-03-08: 80 mg via INTRAVENOUS
  Administered 2019-03-08: 50 mg via INTRAVENOUS

## 2019-03-08 MED ORDER — LIDOCAINE HCL (CARDIAC) PF 100 MG/5ML IV SOSY
PREFILLED_SYRINGE | INTRAVENOUS | Status: DC | PRN
  Administered 2019-03-08: 40 mg via INTRAVENOUS

## 2019-03-08 MED ORDER — STERILE WATER FOR IRRIGATION IR SOLN
Status: DC | PRN
  Administered 2019-03-08: .05 mL

## 2019-03-08 MED ORDER — ACETAMINOPHEN 325 MG PO TABS: 650.0000 mg | ORAL_TABLET | Freq: Once | ORAL | Status: DC | PRN

## 2019-03-08 MED ORDER — LACTATED RINGERS IV SOLN
10.0000 mL/h | INTRAVENOUS | Status: DC
  Administered 2019-03-08: 10 mL/h via INTRAVENOUS

## 2019-03-08 MED ORDER — ACETAMINOPHEN 160 MG/5ML PO SOLN: 325.0000 mg | ORAL | Status: DC | PRN

## 2019-03-08 MED ORDER — ONDANSETRON HCL 4 MG/2ML IJ SOLN: 4.0000 mg | Freq: Once | INTRAMUSCULAR | Status: DC | PRN

## 2019-03-08 SURGICAL SUPPLY — 6 items
CANISTER SUCT 1200ML W/VALVE (MISCELLANEOUS) ×3 IMPLANT
FORCEPS BIOP RAD 4 LRG CAP 4 (CUTTING FORCEPS) ×1 IMPLANT
GOWN CVR UNV OPN BCK APRN NK (MISCELLANEOUS) ×4 IMPLANT
GOWN ISOL THUMB LOOP REG UNIV (MISCELLANEOUS) ×2
KIT ENDO PROCEDURE OLY (KITS) ×3 IMPLANT
WATER STERILE IRR 250ML POUR (IV SOLUTION) ×3 IMPLANT

## 2019-03-08 NOTE — Interval H&P Note (Signed)
History and Physical Interval Note:  03/08/2019 8:35 AM  Tony [name]  has presented today for surgery, with the diagnosis of Abdominal pain R10.84 change in bowel habits R19.4.  The various methods of treatment have been discussed with the patient and family. After consideration of risks, benefits and other options for treatment, the patient has consented to  Procedure(s): COLONOSCOPY WITH PROPOFOL (N/A) as a surgical intervention.  The patient's history has been reviewed, patient examined, no change in status, stable for surgery.  I have reviewed the patient's chart and labs.  Questions were answered to the patient's satisfaction.     Aspasia Rude [employer]

## 2019-03-08 NOTE — Anesthesia Postprocedure Evaluation (Signed)
Anesthesia Post Note  Patient: Tony [name]  Procedure(s) Performed: COLONOSCOPY WITH PROPOFOL (N/A Rectum) POLYPECTOMY (Rectum)  Patient location during evaluation: PACU Anesthesia Type: General Level of consciousness: awake and alert, oriented and patient cooperative Pain management: pain level controlled Vital Signs Assessment: post-procedure vital signs reviewed and stable Respiratory status: spontaneous breathing, nonlabored ventilation and respiratory function stable Cardiovascular status: blood pressure returned to baseline and stable Postop Assessment: adequate PO intake Anesthetic complications: no    Darrin Nipper

## 2019-03-08 NOTE — Transfer of Care (Signed)
Immediate Anesthesia Transfer of Care Note  Patient: Tony [name]  Procedure(s) Performed: COLONOSCOPY WITH PROPOFOL (N/A Rectum) POLYPECTOMY (Rectum)  Patient Location: PACU  Anesthesia Type: General  Level of Consciousness: awake, alert  and patient cooperative  Airway and Oxygen Therapy: Patient Spontanous Breathing and Patient connected to supplemental oxygen  Post-op Assessment: Post-op Vital signs reviewed, Patient's Cardiovascular Status Stable, Respiratory Function Stable, Patent Airway and No signs of Nausea or vomiting  Post-op Vital Signs: Reviewed and stable  Complications: No apparent anesthesia complications

## 2019-03-08 NOTE — Op Note (Signed)
[facility] Patient Name: Tony [name] Procedure Date: 03/08/2019 9:06 AM MRN: [id number] Account #: [id number] Date of Birth: 11/14/1990 Admit Type: Outpatient Age: 28 Room: [facility] OR ROOM 01 Gender: Male Note Status: Finalized Procedure:            Colonoscopy Indications:          Chronic diarrhea, Incidental change in bowel habits                        noted Providers:            Lucilla Lame MD, MD Referring MD:         Halina Maidens, MD (Referring MD) Medicines:            Propofol per Anesthesia Procedure:            Pre-Anesthesia Assessment:                       - Prior to the procedure, a History and Physical was                        performed, and patient medications and allergies were                        reviewed. The patient's tolerance of previous                        anesthesia was also reviewed. The risks and benefits of                        the procedure and the sedation options and risks were                        discussed with the patient. All questions were                        answered, and informed consent was obtained. Prior                        Anticoagulants: The patient has taken no previous                        anticoagulant or antiplatelet agents. ASA Grade                        Assessment: II - A patient with mild systemic disease.                        After reviewing the risks and benefits, the patient was                        deemed in satisfactory condition to undergo the                        procedure.                       After obtaining informed consent, the colonoscope was                        passed under direct vision.  Throughout the procedure,                        the patient's blood pressure, pulse, and oxygen                        saturations were monitored continuously. The was                        introduced through the anus and advanced to the the   terminal ileum. The colonoscopy was performed without                        difficulty. The patient tolerated the procedure well.                        The quality of the bowel preparation was excellent. Findings:      The perianal and digital rectal examinations were normal.      Localized inflammation, mild in severity and characterized by aphthous       ulcerations was found in the terminal ileum. Biopsies were taken with a       cold forceps for histology.      A 4 mm polyp was found in the ascending colon. The polyp was sessile.       The polyp was removed with a cold biopsy forceps. Resection and       retrieval were complete.      Random biopsies were obtained with cold forceps for histology randomly       in the entire colon. Impression:           - Ileitis, rule out inflammatory bowel disease.                        Biopsied.                       - One 4 mm polyp in the ascending colon, removed with a                        cold biopsy forceps. Resected and retrieved.                       - Random biopsies were obtained in the entire colon. Recommendation:       - Discharge patient to home.                       - Resume previous diet.                       - Continue present medications.                       - Await pathology results.                       - Repeat colonoscopy in 5 years if polyp adenoma and 10                        years if hyperplastic Procedure Code(s):    --- Professional ---  [id number], Colonoscopy, flexible; with biopsy, single or                        multiple CPT copyright 2019 American Medical Association. All rights reserved. The codes documented in this report are preliminary and upon coder review may  be revised to meet current compliance requirements. Lucilla Lame MD, MD 03/08/2019 9:34:01 AM This report has been signed electronically. Number of Addenda: 0 Note Initiated On: 03/08/2019 9:06 AM Scope Withdrawal Time: 0  hours 9 minutes 8 seconds  Total Procedure Duration: 0 hours 14 minutes 8 seconds       [facility]

## 2019-03-12 ENCOUNTER — Encounter: Payer: Self-pay | Admitting: Gastroenterology

## 2019-03-13 ENCOUNTER — Ambulatory Visit (INDEPENDENT_AMBULATORY_CARE_PROVIDER_SITE_OTHER): Payer: 59 | Admitting: Urology

## 2019-03-13 ENCOUNTER — Encounter: Payer: Self-pay | Admitting: Urology

## 2019-03-13 ENCOUNTER — Other Ambulatory Visit: Payer: Self-pay

## 2019-03-13 ENCOUNTER — Telehealth: Payer: Self-pay

## 2019-03-13 VITALS — BP 133/82 | HR 73 | Ht 70.0 in | Wt 213.1 lb

## 2019-03-13 DIAGNOSIS — Z3009 Encounter for other general counseling and advice on contraception: Secondary | ICD-10-CM | POA: Diagnosis not present

## 2019-03-13 MED ORDER — DIAZEPAM 10 MG PO TABS: ORAL_TABLET | ORAL | 0 refills | Status: DC

## 2019-03-13 NOTE — Telephone Encounter (Signed)
Pt scheduled for a follow up appt on 03/14/19.

## 2019-03-13 NOTE — Telephone Encounter (Signed)
-----   Message from Lucilla Lame, MD sent at 03/12/2019  4:54 PM EDT ----- Please have the patient come in for a follow up.

## 2019-03-13 NOTE — Patient Instructions (Signed)

## 2019-03-13 NOTE — Progress Notes (Signed)
03/13/2019 2:10 PM   Tony [name] 07-19-1990 [id number]  Referring provider: Glean Hess, MD [address] [place],  [place] [address]  Chief Complaint  Patient presents with   VAS Consult    HPI: Tony [name] is a 28 y.o. male who presents for vasectomy counseling.  He is married with 3 children and his wife is pregnant with her fourth.  They desire vasectomy as a means permanent sterilization.  He denies previous history of urologic problems or prior urologic evaluation.  He denies chronic scrotal pain or history of epididymitis/orchitis.  No previous genitourinary surgery/herniorrhaphy.  Denies anticoagulant use.   PMH: Past Medical History:  Diagnosis Date   Headache    multiple/day.   Seizures ([place])     Surgical History: Past Surgical History:  Procedure Laterality Date   COLONOSCOPY WITH PROPOFOL N/A 03/08/2019   Procedure: COLONOSCOPY WITH PROPOFOL;  Surgeon: Lucilla Lame, MD;  Location: [facility];  Service: Endoscopy;  Laterality: N/A;   LIPOMA EXCISION  2019   lower right back   POLYPECTOMY  03/08/2019   Procedure: POLYPECTOMY;  Surgeon: Lucilla Lame, MD;  Location: [facility];  Service: Endoscopy;;   TONSILLECTOMY      Home Medications:  Allergies as of 03/13/2019   No Known Allergies     Medication List       Accurate as of March 13, 2019  2:10 PM. If you have any questions, ask your nurse or doctor.        STOP taking these medications   polyethylene glycol-electrolytes 420 g solution Commonly known as: NuLYTELY/GoLYTELY Stopped by: Abbie Sons, MD     TAKE these medications   methocarbamol 500 MG tablet Commonly known as: ROBAXIN Take 1 tablet (500 mg total) by mouth 4 (four) times daily.       Allergies: No Known Allergies  Family History: Family History  Problem Relation Age of Onset   Crohn's disease Mother     Social History:  reports that he quit smoking about 2 years ago. His  smoking use included cigarettes. He quit after 2.00 years of use. He has never used smokeless tobacco. He reports current alcohol use of about 3.0 standard drinks of alcohol per week. He reports that he does not use drugs.  ROS: UROLOGY Frequent Urination?: Yes Hard to postpone urination?: No Burning/pain with urination?: No Get up at night to urinate?: No Leakage of urine?: No Urine stream starts and stops?: No Trouble starting stream?: No Do you have to strain to urinate?: No Blood in urine?: No Urinary tract infection?: No Sexually transmitted disease?: No Injury to kidneys or bladder?: No Painful intercourse?: No Weak stream?: No Erection problems?: No Penile pain?: No  Gastrointestinal Nausea?: No Vomiting?: No Indigestion/heartburn?: Yes Diarrhea?: Yes Constipation?: Yes  Constitutional Fever: No Night sweats?: No Weight loss?: No Fatigue?: No  Skin Skin rash/lesions?: No Itching?: No  Eyes Blurred vision?: No Double vision?: No  Ears/Nose/Throat Sore throat?: No Sinus problems?: No  Hematologic/Lymphatic Swollen glands?: No Easy bruising?: No  Cardiovascular Leg swelling?: No Chest pain?: No  Respiratory Cough?: No Shortness of breath?: No  Endocrine Excessive thirst?: Yes  Musculoskeletal Back pain?: Yes Joint pain?: Yes  Neurological Headaches?: Yes Dizziness?: Yes  Psychologic Depression?: Yes Anxiety?: Yes  Physical Exam: BP 133/82 (BP Location: Left Arm, Patient Position: Sitting, Cuff Size: Normal)    Pulse 73    Ht 5\' 10"  (1.778 m)    Wt 213 lb 1.6 oz (  96.7 kg)    BMI 30.58 kg/m   Constitutional:  Alert and oriented, No acute distress. HEENT: [place] AT, moist mucus membranes.  Trachea midline, no masses. Cardiovascular: No clubbing, cyanosis, or edema. Respiratory: Normal respiratory effort, no increased work of breathing. GI: Abdomen is soft, nontender, nondistended, no abdominal masses GU: Phallus circumcised without  lesions, testes descended bilaterally without masses or tenderness.  Spermatic cord/epididymis palpably normal bilaterally.  Vasa palpable bilaterally. Skin: No rashes, bruises or suspicious lesions. Neurologic: Grossly intact, no focal deficits, moving all 4 extremities. Psychiatric: Normal mood and affect.   Assessment & Plan:    - Undesired fertility We had a long discussion about vasectomy. We specifically discussed the procedure, recovery and the risks, benefits and alternatives of vasectomy. I explained that the procedure entails removal of a segment of each vas deferens, each of which conducts sperm, and that the purpose of this procedure is to cause sterility (inability to produce children or cause pregnancy). Vasectomy is intended to be permanent and irreversible form of contraception. Options for fertility after vasectomy include vasectomy reversal, or sperm retrieval with in vitro fertilization. These options are not always successful, and they may be expensive. We discussed reversible forms of birth control such as condoms, IUD or diaphragms, as well as the option of freezing sperm in a sperm bank prior to the vasectomy procedure. We discussed the importance of avoiding strenuous exercise for four days after vasectomy, and the importance of refraining from any form of ejaculation for seven days after vasectomy. I explained that vasectomy does not produce immediate sterility so another form of contraceptive must be used until sterility is assured by having semen checked for sperm. Thus, a post vasectomy semen analysis is necessary to confirm sterility. Rarely, vasectomy must be repeated. We discussed the approximately 1 in 2,000 risk of pregnancy after vasectomy for men who have post-vasectomy semen analysis showing absent sperm or rare non-motile sperm. Typical side effects include a small amount of oozing blood, some discomfort and mild swelling in the area of incision.  Vasectomy does not  affect sexual performance, function, please, sensation, interest, desire, satisfaction, penile erection, volume of semen or ejaculation. Other rare risks include allergy or adverse reaction to an anesthetic, testicular atrophy, hematoma, infection/abscess, prolonged tenderness of the vas deferens, pain, swelling, painful nodule or scar (called sperm granuloma) or epididymtis. We discussed chronic testicular pain syndrome. This has been reported to occur in as many as 1-2% of men and may be permanent. This can be treated with medication, small procedures or (rarely) surgery.  He did desire a preprocedure anxiolytic and Rx Valium was sent to pharmacy.  He was informed he will need a driver if utilizing this medication.   Abbie Sons, [place] [address] [place], [place] [address] [phone]

## 2019-03-14 ENCOUNTER — Encounter: Payer: Self-pay | Admitting: Gastroenterology

## 2019-03-14 ENCOUNTER — Other Ambulatory Visit
Admission: RE | Admit: 2019-03-14 | Discharge: 2019-03-14 | Disposition: A | Discharge status: Home / Self Care | Payer: 59 | Attending: Gastroenterology | Admitting: Gastroenterology

## 2019-03-14 ENCOUNTER — Ambulatory Visit (INDEPENDENT_AMBULATORY_CARE_PROVIDER_SITE_OTHER): Payer: 59 | Admitting: Gastroenterology

## 2019-03-14 VITALS — BP 115/71 | HR 75 | Temp 97.4°F | Ht 70.0 in | Wt 214.2 lb

## 2019-03-14 DIAGNOSIS — R197 Diarrhea, unspecified: Secondary | ICD-10-CM

## 2019-03-14 MED ORDER — MESALAMINE 400 MG PO CPDR: 800.0000 mg | DELAYED_RELEASE_CAPSULE | Freq: Three times a day (TID) | ORAL | 3 refills | Status: DC

## 2019-03-14 NOTE — Progress Notes (Signed)
Primary Care Physician: Glean Hess, MD  Primary Gastroenterologist:  Dr. Lucilla Lame  Chief Complaint  Patient presents with  . Follow up procedure result    HPI: Tony [name] is a 28 y.o. male here for follow-up after having a colonoscopy.  The patient does have a family history of Crohn's disease in his mother and has come to see me for diarrhea.  Patient had normal biopsies of the colon but the terminal ileum showed acute active ileitis.  The patient continues to have diarrhea.  The differential diagnosis for the patient's pathology was reported as NSAIDs versus early inflammatory bowel disease.  The patient denies any NSAID use a many months.  The patient's polyp also was shown to be a adenoma.  Current Outpatient Medications  Medication Sig Dispense Refill  . diazepam (VALIUM) 10 MG tablet 1 tab po 30 min prior to procedure 1 tablet 0  . methocarbamol (ROBAXIN) 500 MG tablet Take 1 tablet (500 mg total) by mouth 4 (four) times daily. 40 tablet 1  . Mesalamine (ASACOL) 400 MG CPDR DR capsule Take 2 capsules (800 mg total) by mouth 3 (three) times daily. 180 capsule 3   No current facility-administered medications for this visit.     Allergies as of 03/14/2019  . (No Known Allergies)    ROS:  General: Negative for anorexia, weight loss, fever, chills, fatigue, weakness. ENT: Negative for hoarseness, difficulty swallowing , nasal congestion. CV: Negative for chest pain, angina, palpitations, dyspnea on exertion, peripheral edema.  Respiratory: Negative for dyspnea at rest, dyspnea on exertion, cough, sputum, wheezing.  GI: See history of present illness. GU:  Negative for dysuria, hematuria, urinary incontinence, urinary frequency, nocturnal urination.  Endo: Negative for unusual weight change.    Physical Examination:   BP 115/71   Pulse 75   Temp (!) 97.4 F (36.3 C) (Temporal)   Ht 5\' 10"  (1.778 m)   Wt 214 lb 3.2 oz (97.2 kg)   BMI 30.73 kg/m    General: Well-nourished, well-developed in no acute distress.  Eyes: No icterus. Conjunctivae pink. Extremities: No lower extremity edema. No clubbing or deformities. Neuro: Alert and oriented x 3.  Grossly intact. Skin: Warm and dry, no jaundice.   Psych: Alert and cooperative, normal mood and affect.  Labs:    Imaging Studies: No results found.  Assessment and Plan:   Tony [name] is a 28 y.o. y/o male who comes in today for follow-up after having a colonoscopy for diarrhea.  As mentioned above the patient has a family history of inflammatory bowel disease.  The patient but she showed some active ileitis without any sign of chronicity and terminal ileum consistent possible NSAID use versus early inflammatory bowel disease.  The patient denies any NSAID use.  He will be tried on a trial of mesalamine to see if his symptoms improve.  The patient will also have his blood sent off celiac sprue antibodies and he has been told that he will need a repeat colonoscopy in 5 years due to his adenoma.  The patient has also been told to contact his first-degree relatives to see if they have had a colonoscopy and if not be recommended that they do.  The patient will let me know if his symptoms do not improve.  This visit consisted of 20 minutes face to face contact with myself and at least 50% of this time was spent in counseling and education regarding diagnosis, treatment options, medication management, risks and benefits  of treatment.   Lucilla Lame, MD. [name]    Note: This dictation was prepared with Dragon dictation along with smaller phrase technology. Any transcriptional errors that result from this process are unintentional.

## 2019-03-16 LAB — CELIAC DISEASE PANEL
Endomysial Ab, IgA: NEGATIVE
IgA: 119 mg/dL (ref 90–386)
Tissue Transglutaminase Ab, IgA: <2 U/mL (ref 0–3)

## 2019-03-26 ENCOUNTER — Other Ambulatory Visit: Payer: Self-pay | Admitting: Neurology

## 2019-03-26 DIAGNOSIS — R569 Unspecified convulsions: Secondary | ICD-10-CM

## 2019-04-15 ENCOUNTER — Other Ambulatory Visit: Payer: 59

## 2019-04-19 ENCOUNTER — Inpatient Hospital Stay: Admission: RE | Admit: 2019-04-19 | Payer: 59 | Source: Ambulatory Visit

## 2019-04-25 ENCOUNTER — Encounter: Payer: Self-pay | Admitting: Urology

## 2019-04-25 ENCOUNTER — Ambulatory Visit (INDEPENDENT_AMBULATORY_CARE_PROVIDER_SITE_OTHER): Payer: 59 | Admitting: Urology

## 2019-04-25 ENCOUNTER — Other Ambulatory Visit: Payer: Self-pay

## 2019-04-25 VITALS — BP 113/73 | HR 83 | Ht 70.0 in | Wt 214.0 lb

## 2019-04-25 DIAGNOSIS — Z302 Encounter for sterilization: Secondary | ICD-10-CM

## 2019-04-25 DIAGNOSIS — Z9852 Vasectomy status: Secondary | ICD-10-CM | POA: Diagnosis not present

## 2019-04-25 HISTORY — PX: VASECTOMY: SHX75

## 2019-04-25 NOTE — Patient Instructions (Signed)
Vasectomy, Care After  This sheet gives you information about how to care for yourself after your procedure. Your health care provider may also give you more specific instructions. If you have problems or questions, contact your health care provider.  What can I expect after the procedure?  After your procedure, it is common to have:   Mild pain, swelling, redness, or discomfort in your scrotum.   Some blood coming from your incisions or puncture sites for one or two days.   Blood in your semen.  Follow these instructions at home:  Medicines     Take over-the-counter and prescription medicines only as told by your health care provider.   Avoid taking NSAIDs such as aspirin and ibuprofen, because these medicines can make bleeding worse.  Activity   For the first 2 days after surgery, avoid physical activity and exercise that require a lot of energy. Ask your health care provider what activities are safe for you.   Do not participate in sports or perform heavy physical labor until your pain has improved, or until your health care provider says it is okay.   Do not ejaculate for at least 1 week after the procedure, or as long as directed.   You may resume sexual activity 7-10 days after your procedure, or when your health care provider approves. Use a different method of birth control (contraception) until you have had test results that confirm that there is no sperm in your semen.  Scrotal support   Use scrotal support, such as a jock strap or underwear with a supportive pouch, as needed for one week after your procedure.   If you feel discomfort in your scrotum, you may remove the scrotal support to see if the discomfort is relieved. Sometimes scrotal support can press on the scrotum and cause or worsen discomfort.   If your skin gets irritated, you may add some germ-free (sterile), fluffed bandages or a clean washcloth to the scrotal support.  General instructions   Put ice on the injured area:  ? Put  ice in a plastic bag.  ? Place a towel between your skin and the bag.  ? Leave the ice on for 20 minutes, 2-3 times a day.   Check your incisions or puncture sites every day for signs of infection. Check for:  ? Redness, swelling, or pain.  ? Fluid or blood.  ? Warmth.  ? Pus or a bad smell.   Leave stitches (sutures) in place. The sutures will dissolve on their own and do not need to be removed.   Keep all follow-up visits as told by your health care provider. This is important because you will need a test to confirm that there is no sperm in your semen. Multiple ejaculations are needed to clear out sperm that were beyond the vasectomy site. You will need one test result showing that there is no sperm in your semen before you can resume unprotected sex. This may take 2-4 months after your procedure.   Do not drive for 24 hours if you were given a sedative to help you relax.  Contact a health care provider if:   You have redness, swelling, or more pain around your incision or puncture site, or in your scrotum area in general.   You have bleeding from your incision or puncture site.   You have pus or a bad smell coming from your incision or puncture site.   You have a fever.   Your incision or   Avoid physical activity and exercise that requires a lot of energy for the first 2 days after surgery.  Put ice on the injured area. Leave the ice on for 20 minutes, 2-3 times a day.  Do not drive for 24 hours if you were given a sedative to help you relax. This information is not intended to replace advice given to you by your health care provider. Make sure you discuss any questions you have with your health care provider. Document Released: 12/10/2004 Document Revised: 05/05/2017 Document  Reviewed: 08/19/2016 Elsevier Patient Education  [place].

## 2019-04-26 ENCOUNTER — Encounter: Payer: Self-pay | Admitting: Urology

## 2019-04-26 MED ORDER — HYDROCODONE-ACETAMINOPHEN 5-325 MG PO TABS: 1.0000 | ORAL_TABLET | ORAL | 0 refills | Status: DC | PRN

## 2019-04-26 NOTE — Progress Notes (Signed)
Vasectomy Procedure Note  Indications: The patient is a 28 y.o. male who presents today for elective sterilization.  He has been consented for the procedure.  He is aware of the risks and benefits.  He had no additional questions.  He agrees to proceed.  He denies any other significant change since his last visit.  Pre-operative Diagnosis: Elective sterilization  Post-operative Diagnosis: Elective sterilization  Premedication: Valium 10 mg po  Surgeon: Nicki Reaper C. Kanesha Cadle, M.D  Description: The patient was prepped and draped in the standard fashion.  The right vas deferens was identified and brought superiorly to the anterior scrotal skin.  The skin and vas was then anesthetized utilizing 8 ml 1% lidocaine.  A small stab incision was made and spread with the vas dissector.  The vas was grasped utilizing the vas clamp and elevated out of the incision.  The vas was dissected free from surrounding tissue and vessels and an ~1 cm segment was excised.  The vas lumens were cauterized utilizing electrocautery.  The distal segment was buried in the surrounding sheath with a 3-0 chromic suture.  No significant bleeding was observed.  The vas ends were then dropped back into the hemiscrotum.  The skin was closed with hemostatic pressure.  An identical procedure was performed on the contralateral side.  Clean dry gauze was applied to the incision sites.  The patient tolerated the procedure well.  Complications:None  Recommendations: 1.  No lifting greater than 10 pounds or strenuousactivity for 1 week. 2.  Scrotal support for 1 week. 3.  Shower only for 1 week; may shower in the morning 4.  May resume intercourse in one week if no significant discomfort.  Continue alternate contraception for 12 weeks.  5.  Call for significant pain, swelling, redness, drainage or fever greater than 100.5. 6.  Rx hydrocodone/APAP 5/325 1-2 every 6 hours as needed for pain. 7.  Follow-up semen analysis12 weeks.   John Giovanni, MD

## 2019-04-27 ENCOUNTER — Encounter: Payer: Self-pay | Admitting: Urology

## 2019-04-29 NOTE — Telephone Encounter (Signed)
Pt send message on Saturday about being in pain and needing something stronger than Tylenol, as discussed with [place].

## 2019-05-02 ENCOUNTER — Encounter: Payer: Self-pay | Admitting: Urology

## 2019-05-19 ENCOUNTER — Telehealth: Payer: 59 | Admitting: Physician Assistant

## 2019-05-19 DIAGNOSIS — M545 Low back pain, unspecified: Secondary | ICD-10-CM

## 2019-05-19 NOTE — Progress Notes (Signed)
Based on what you shared with me, I feel your condition warrants further evaluation and I recommend that you be seen for a face to face office visit.  Due to severity of pain, medical history and the fact that there is nerve involvement (pain radiating into legs) you need to be seen in person for examination and so most appropriate acute and ongoing treatment can be given.    NOTE: If you entered your credit card information for this eVisit, you will not be charged. You may see a "hold" on your card for the $35 but that hold will drop off and you will not have a charge processed.   If you are having a true medical emergency please call 911.      For an urgent face to face visit, [facility] has five urgent care centers for your convenience:      NEW:  [facility] at [place] Get Driving Directions [id number] [place], [place] [address] . 10 am - 6pm Monday - Friday    [facility]) Get Driving Directions [id number] [address] [place], [place] [address] . 10 am to 8 pm Monday-Friday . 12 pm to 8 pm [facility] at [facility] Get Driving Directions [id number] [place], [place], [place] [address] . 8 am to 8 pm Monday-Friday . 9 am to 6 pm Saturday . 11 am to 6 pm Sunday     [facility] at [facility] Get Driving Directions  [id number] [address] [place], [place] [address] . 8 am to 8 pm Monday-Friday . 8 am to 4 pm [facility] Urgent Care at [place] Get Driving Directions [id number] [place] [place],  [address] . 12 pm to 6 pm Monday-Friday      Your e-visit answers were reviewed by a board certified advanced clinical practitioner to complete your personal care plan.  Thank you for using e-Visits.

## 2019-06-21 ENCOUNTER — Encounter: Payer: Self-pay | Admitting: Internal Medicine

## 2019-06-21 ENCOUNTER — Ambulatory Visit: Payer: 59 | Admitting: Internal Medicine

## 2019-07-08 ENCOUNTER — Other Ambulatory Visit: Payer: Self-pay

## 2019-07-08 ENCOUNTER — Encounter: Payer: Self-pay | Admitting: Emergency Medicine

## 2019-07-08 ENCOUNTER — Ambulatory Visit (INDEPENDENT_AMBULATORY_CARE_PROVIDER_SITE_OTHER)
Admit: 2019-07-08 | Discharge: 2019-07-08 | Disposition: A | Discharge status: Home / Self Care | Payer: 59 | Attending: Emergency Medicine | Admitting: Emergency Medicine

## 2019-07-08 ENCOUNTER — Ambulatory Visit
Admission: EM | Admit: 2019-07-08 | Discharge: 2019-07-08 | Disposition: A | Discharge status: Home / Self Care | Payer: 59 | Attending: Family Medicine | Admitting: Family Medicine

## 2019-07-08 DIAGNOSIS — N50811 Right testicular pain: Secondary | ICD-10-CM

## 2019-07-08 MED ORDER — NAPROXEN 500 MG PO TABS: 500.0000 mg | ORAL_TABLET | Freq: Two times a day (BID) | ORAL | 0 refills | Status: DC
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
## 2019-07-08 NOTE — ED Triage Notes (Signed)
Pt c/o right testicular pain. Started about 3-4 days ago. He states he had a vasectomy a couple of months ago.

## 2019-07-08 NOTE — Discharge Instructions (Addendum)
Avoid symptoms as much as possible.  Take naproxen daily with food.  Use ice as necessary for pain.  Wear supportive underwear as much as possible.  Do not strain by lifting heavy items until you are cleared by the urologist.

## 2019-07-09 ENCOUNTER — Encounter: Payer: Self-pay | Admitting: Urology

## 2019-07-09 ENCOUNTER — Ambulatory Visit: Payer: 59 | Admitting: Urology

## 2019-07-09 ENCOUNTER — Ambulatory Visit
Admission: RE | Admit: 2019-07-09 | Discharge: 2019-07-09 | Disposition: A | Discharge status: Home / Self Care | Payer: 59 | Source: Ambulatory Visit | Attending: Urology | Admitting: Urology

## 2019-07-09 VITALS — BP 122/80 | HR 65 | Ht 70.0 in | Wt 210.0 lb

## 2019-07-09 DIAGNOSIS — N50819 Testicular pain, unspecified: Secondary | ICD-10-CM

## 2019-07-09 DIAGNOSIS — N50811 Right testicular pain: Secondary | ICD-10-CM

## 2019-07-09 LAB — BLADDER SCAN AMB NON-IMAGING: Scan Result: 13

## 2019-07-09 MED ORDER — DOXYCYCLINE HYCLATE 100 MG PO CAPS: 100.0000 mg | ORAL_CAPSULE | Freq: Two times a day (BID) | ORAL | 0 refills | Status: DC
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
## 2019-07-11 LAB — URINALYSIS, COMPLETE
Bilirubin, UA: NEGATIVE
Glucose, UA: NEGATIVE
Ketones, UA: NEGATIVE
Leukocytes,UA: NEGATIVE
Nitrite, UA: NEGATIVE
Protein,UA: NEGATIVE
RBC, UA: NEGATIVE
Specific Gravity, UA: 1.025 (ref 1.005–1.030)
Urobilinogen, Ur: 0.2 mg/dL (ref 0.2–1.0)
pH, UA: 6 (ref 5.0–7.5)

## 2019-07-11 LAB — MICROSCOPIC EXAMINATION: Bacteria, UA: NONE SEEN

## 2019-07-12 ENCOUNTER — Telehealth: Payer: Self-pay | Admitting: Urology

## 2019-07-12 DIAGNOSIS — K4090 Unilateral inguinal hernia, without obstruction or gangrene, not specified as recurrent: Secondary | ICD-10-CM
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
## 2019-07-15 ENCOUNTER — Telehealth: Payer: Self-pay

## 2019-07-15 NOTE — Telephone Encounter (Signed)
Patient called today stating that he has an appointment to see surgery in regards to his hernia. He wants to know if he can go to work or if he needs restrictions. 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 He states he needs a note if possible from [place] for work. If we do write a note he asks that it be specific as to why he needs to be out due to the hernia

## 2019-07-16 NOTE — Telephone Encounter (Signed)
Gen surgery appointment is tomorrow.

## 2019-07-16 NOTE — Telephone Encounter (Signed)
I would like to keep him out of work until he sees general surgery.  When is that appointment?

## 2019-07-17 ENCOUNTER — Other Ambulatory Visit: Payer: Self-pay

## 2019-07-17 ENCOUNTER — Encounter: Payer: Self-pay | Admitting: Surgery

## 2019-07-17 ENCOUNTER — Ambulatory Visit: Payer: 59 | Admitting: Surgery

## 2019-07-17 VITALS — BP 125/84 | HR 94 | Temp 97.7°F | Ht 70.0 in | Wt 207.0 lb

## 2019-07-17 DIAGNOSIS — Z9852 Vasectomy status: Secondary | ICD-10-CM

## 2019-07-17 DIAGNOSIS — K4090 Unilateral inguinal hernia, without obstruction or gangrene, not specified as recurrent: Secondary | ICD-10-CM

## 2019-07-17 NOTE — Patient Instructions (Addendum)
Dr.Piscoya discussed the different type of treatment options for Inguinal Hernia with patient at today's visit. Dr.Piscoya notified patient that he can let his body be the determinant of whether he would like to move forward with surgery or to keep an eye out for worsens symptoms.  Patient was advised to discuss the recovery process of no heavy lifting of more than 15-20 lbs for a total of 6 weeks with his new place of employment.   Inguinal Hernia, Adult An inguinal hernia develops when fat or the intestines push through a weak spot in a muscle where your leg meets your lower abdomen (groin). This creates a bulge. This kind of hernia could also be:  In your scrotum, if you are male.  In folds of skin around your vagina, if you are male. There are three types of inguinal hernias:  Hernias that can be pushed back into the abdomen (are reducible). This type rarely causes pain.  Hernias that are not reducible (are incarcerated).  Hernias that are not reducible and lose their blood supply (are strangulated). This type of hernia requires emergency surgery. What are the causes? This condition is caused by having a weak spot in the muscles or tissues in the groin. This weak spot develops over time. The hernia may poke through the weak spot when you suddenly strain your lower abdominal muscles, such as when you:  Lift a heavy object.  Strain to have a bowel movement. Constipation can lead to straining.  Cough. What increases the risk? This condition is more likely to develop in:  Men.  Pregnant women.  People who: ? Are overweight. ? Work in jobs that require long periods of standing or heavy lifting. ? Have had an inguinal hernia before. ? Smoke or have lung disease. These factors can lead to long-lasting (chronic) coughing. What are the signs or symptoms? Symptoms may depend on the size of the hernia. Often, a small inguinal hernia has no symptoms. Symptoms of a larger hernia may  include:  A lump in the groin area. This is easier to see when standing. It might not be visible when lying down.  Pain or burning in the groin. This may get worse when lifting, straining, or coughing.  A dull ache or a feeling of pressure in the groin.  In men, an unusual lump in the scrotum. Symptoms of a strangulated inguinal hernia may include:  A bulge in your groin that is very painful and tender to the touch.  A bulge that turns red or purple.  Fever, nausea, and vomiting.  Inability to have a bowel movement or to pass gas. How is this diagnosed? This condition is diagnosed based on your symptoms, your medical history, and a physical exam. Your health care provider may feel your groin area and ask you to cough. How is this treated? Treatment depends on the size of your hernia and whether you have symptoms. If you do not have symptoms, your health care provider may have you watch your hernia carefully and have you come in for follow-up visits. If your hernia is large or if you have symptoms, you may need surgery to repair the hernia. Follow these instructions at home: Lifestyle  Avoid lifting heavy objects.  Avoid standing for long periods of time.  Do not use any products that contain nicotine or tobacco, such as cigarettes and e-cigarettes. If you need help quitting, ask your health care provider.  Maintain a healthy weight. Preventing constipation  Take actions to prevent  constipation. Constipation leads to straining with bowel movements, which can make a hernia worse or cause a hernia repair to break down. Your health care provider may recommend that you: ? Drink enough fluid to keep your urine pale yellow. ? Eat foods that are high in fiber, such as fresh fruits and vegetables, whole grains, and beans. ? Limit foods that are high in fat and processed sugars, such as fried or sweet foods. ? Take an over-the-counter or prescription medicine for constipation. General  instructions  You may try to push the hernia back in place by very gently pressing on it while lying down. Do not try to force the bulge back in if it will not push in easily.  Watch your hernia for any changes in shape, size, or color. Get help right away if you notice any changes.  Take over-the-counter and prescription medicines only as told by your health care provider.  Keep all follow-up visits as told by your health care provider. This is important. Contact a health care provider if:  You have a fever.  You develop new symptoms.  Your symptoms get worse. Get help right away if:  You have pain in your groin that suddenly gets worse.  You have a bulge in your groin that: ? Suddenly gets bigger and does not get smaller. ? Becomes red or purple or painful to the touch.  You are a man and you have a sudden pain in your scrotum, or the size of your scrotum suddenly changes.  You cannot push the hernia back in place by very gently pressing on it when you are lying down. Do not try to force the bulge back in if it will not push in easily.  You have nausea or vomiting that does not go away.  You have a fast heartbeat.  You cannot have a bowel movement or pass gas. These symptoms may represent a serious problem that is an emergency. Do not wait to see if the symptoms will go away. Get medical help right away. Call your local emergency services (911 in the [place] Summary  An inguinal hernia develops when fat or the intestines push through a weak spot in a muscle where your leg meets your lower abdomen (groin).  This condition is caused by having a weak spot in muscles or tissue in your groin.  Symptoms may depend on the size of the hernia, and they may include pain or swelling in your groin. A small inguinal hernia often has no symptoms.  Treatment may not be needed if you do not have symptoms. If you have symptoms or a large hernia, you may need surgery to repair the hernia.   Avoid lifting heavy objects. Also avoid standing for long amounts of time. This information is not intended to replace advice given to you by your health care provider. Make sure you discuss any questions you have with your health care provider. Document Revised: 06/24/2017 Document Reviewed: 02/22/2017 Elsevier Patient Education  [place].

## 2019-07-18 ENCOUNTER — Other Ambulatory Visit: Payer: Self-pay

## 2019-07-18 ENCOUNTER — Other Ambulatory Visit: Payer: 59

## 2019-07-18 ENCOUNTER — Encounter: Payer: Self-pay | Admitting: Surgery

## 2019-07-18 DIAGNOSIS — Z9852 Vasectomy status: Secondary | ICD-10-CM

## 2019-07-18 NOTE — H&P (View-Only) (Signed)
07/18/2019  Reason for Visit:  Right inguinal hernia  Referring Provider:  Zara Council, PA-C  History of Present Illness: Tony [name] is a 29 y.o. male presenting today for evaluation of a right inguinal hernia.  He initially presented to the ED on 07/08/19 with about a one week history of right testicular pain.  He had an ultrasound done in the ED which was negative for torsion or masses.  He followed up with Urology on 2/2 and his exam was unremarkable.  As a precaution, was started on Doxycycline.  He had an outpatient CT scan on 2/2 which noted a small right inguinal hernia.  He reports today that he feels the antibiotic is not helping and he feels that although the main pain has improved, it is still persistent.  He does report that that pain has shifted from his testicle which was initially, to the groin area instead.  The pain does not radiate otherwise.  He reports that sometimes he will feel more pressure in the groin when he stands up to void or when he sits down to have a bowel movement.  Denies any constipation or blood in the stool.  Denies any fevers, chills, chest pain, shortness of breath.  Has had a vasectomy on 04/25/19 with Dr. Bernardo Heater.  Of note, he reports that he is trying to change jobs.  His current job requires weight lifting of about 100 lbs, and is trying to change jobs to something with lighter duties, about 40-50 lbs.  His new employer is waiting to see what will happen with the hernia prior to confirming a start date.  Past Medical History: Past Medical History:  Diagnosis Date  . Headache    multiple/day.  . Seizures ([place])      Past Surgical History: Past Surgical History:  Procedure Laterality Date  . COLONOSCOPY WITH PROPOFOL N/A 03/08/2019   Procedure: COLONOSCOPY WITH PROPOFOL;  Surgeon: Lucilla Lame, MD;  Location: [facility];  Service: Endoscopy;  Laterality: N/A;  . LIPOMA EXCISION  2019   lower right back  . POLYPECTOMY  03/08/2019   Procedure: POLYPECTOMY;  Surgeon: Lucilla Lame, MD;  Location: [facility];  Service: Endoscopy;;  . TONSILLECTOMY    . VASECTOMY  04/25/2019    Home Medications: Prior to Admission medications   Medication Sig Start Date End Date Taking? Authorizing Provider  Cholecalciferol (VITAMIN D3 PO) Take by mouth.   Yes [provider]  cyanocobalamin (,VITAMIN B-12,) 1000 MCG/ML injection inject 69mL IM once a week for four weeks then once a month for four months. 03/26/19  Yes [provider]  doxycycline (VIBRAMYCIN) 100 MG capsule Take 1 capsule (100 mg total) by mouth every 12 (twelve) hours. 07/09/19  Yes McGowan, Larene Beach A, PA-C  naproxen (NAPROSYN) 500 MG tablet Take 1 tablet (500 mg total) by mouth 2 (two) times daily with a meal. 07/08/19  Yes Lorin Picket, PA-C  topiramate (TOPAMAX) 25 MG tablet Take 100 mg by mouth daily. 03/21/19  Yes [provider]  Mesalamine (ASACOL) 400 MG CPDR DR capsule Take 2 capsules (800 mg total) by mouth 3 (three) times daily. 03/14/19 07/08/19  Lucilla Lame, MD    Allergies: No Known Allergies  Social History:  reports that he quit smoking about 2 years ago. His smoking use included cigarettes. He quit after 2.00 years of use. He has never used smokeless tobacco. He reports current alcohol use of about 3.0 standard drinks of alcohol per week. He reports that  he does not use drugs.   Family History: Family History  Problem Relation Age of Onset  . Crohn's disease Mother     Review of Systems: Review of Systems  Constitutional: Negative for chills and fever.  HENT: Negative for hearing loss.   Eyes: Negative for blurred vision.  Respiratory: Negative for shortness of breath.   Cardiovascular: Negative for chest pain.  Gastrointestinal: Positive for abdominal pain (right groin). Negative for blood in stool, constipation, diarrhea, nausea and vomiting.  Genitourinary: Negative for dysuria.       Initial right  testicular pain  Musculoskeletal: Negative for myalgias.  Skin: Negative for rash.  Neurological: Negative for dizziness.  Psychiatric/Behavioral: Negative for depression.    Physical Exam BP 125/84   Pulse 94   Temp 97.7 F (36.5 C) (Temporal)   Ht 5\' 10"  (1.778 m)   Wt 207 lb (93.9 kg)   SpO2 98%   BMI 29.70 kg/m  CONSTITUTIONAL: No acute distress HEENT:  Normocephalic, atraumatic, extraocular motion intact. NECK: Trachea is midline, and there is no jugular venous distension.  RESPIRATORY:  Lungs are clear, and breath sounds are equal bilaterally. Normal respiratory effort without pathologic use of accessory muscles. CARDIOVASCULAR: Heart is regular without murmurs, gallops, or rubs. GI: The abdomen is soft, non-distended, with discomfort/tenderness to palpation along the inguinal canal in the right groin.  On exam, there is a small right inguinal hernia, which is reducible.  There is no hernia palpable on the left side.  There is no umbilical hernia.  GU: bilateral scrotum without any swelling or induration.  There is no pain on palpation of the scrotum or testicles. MUSCULOSKELETAL:  Normal muscle strength and tone in all four extremities.  No peripheral edema or cyanosis. SKIN: Skin turgor is normal. There are no pathologic skin lesions.  NEUROLOGIC:  Motor and sensation is grossly normal.  Cranial nerves are grossly intact. PSYCH:  Alert and oriented to person, place and time. Affect is normal.  Laboratory Analysis: Urinalysis 07/09/19: Negative LE, Nitrite, no bacteria seen, negative for RBC.  Imaging: CT renal stone 07/09/19: IMPRESSION: 1. Small right inguinal hernia contains only fat. 2. No urinary stone disease. No secondary changes in either kidney or ureter.   Assessment and Plan: This is a 29 y.o. male with a new, symptomatic right inguinal hernia.  --Discussed with the patient that he does have a right inguinal hernia on exam as demonstrated on his CT scan.  The  hernia is easily reducible, but he does have discomfort when pushing up the external inguinal ring to palpate the hernia.  The scrotum and testicle do not show any symptoms, but he is tender in the groin area distally.   --Discussed with him that the soreness/pain may be related to irritation of the inguinal nerves from the initial hernia bulging/protrusion.  The nerves may be irritated with each time the hernia protrudes as well.   --Based on his symptoms and hernia finding, he would be a candidate for hernia repair.  Discussed with him the role for a laparoscopic right inguinal hernia repair.  Based on current scheduling timing, we could schedule his surgery for 2/23 or 2/24, and based on his work duties, he would have a weight lifting/pushing restriction for 6 weeks after surgery.  He is worried about what this would mean for his current job and the job he is trying to start.  I discussed with him that there is flexibility about timing of surgery if he wanted to wait  longer, but my worry is that resuming work will only aggravate the symptoms.  He is going to discuss with his job about timing and will give [place] a call with his decision about when to do surgery so we can schedule him.   --Discussed with him the risks of bleeding, infection, injury to surrounding structures, post-op recovery, pain control, activity restrictions, and he's willing to proceed when he's able to schedule.  He understands that he would need a COVID test prior to surgery.  Face-to-face time spent with the patient and care providers was 60 minutes, with more than 50% of the time spent counseling, educating, and coordinating care of the patient.     Melvyn Neth, [facility]

## 2019-07-18 NOTE — Progress Notes (Signed)
07/18/2019  Reason for Visit:  Right inguinal hernia  Referring Provider:  Zara Council, PA-C  History of Present Illness: Tony [name] is a 29 y.o. male presenting today for evaluation of a right inguinal hernia.  He initially presented to the ED on 07/08/19 with about a one week history of right testicular pain.  He had an ultrasound done in the ED which was negative for torsion or masses.  He followed up with Urology on 2/2 and his exam was unremarkable.  As a precaution, was started on Doxycycline.  He had an outpatient CT scan on 2/2 which noted a small right inguinal hernia.  He reports today that he feels the antibiotic is not helping and he feels that although the main pain has improved, it is still persistent.  He does report that that pain has shifted from his testicle which was initially, to the groin area instead.  The pain does not radiate otherwise.  He reports that sometimes he will feel more pressure in the groin when he stands up to void or when he sits down to have a bowel movement.  Denies any constipation or blood in the stool.  Denies any fevers, chills, chest pain, shortness of breath.  Has had a vasectomy on 04/25/19 with Dr. Bernardo Heater.  Of note, he reports that he is trying to change jobs.  His current job requires weight lifting of about 100 lbs, and is trying to change jobs to something with lighter duties, about 40-50 lbs.  His new employer is waiting to see what will happen with the hernia prior to confirming a start date.  Past Medical History: Past Medical History:  Diagnosis Date  . Headache    multiple/day.  . Seizures ([place])      Past Surgical History: Past Surgical History:  Procedure Laterality Date  . COLONOSCOPY WITH PROPOFOL N/A 03/08/2019   Procedure: COLONOSCOPY WITH PROPOFOL;  Surgeon: Lucilla Lame, MD;  Location: [facility];  Service: Endoscopy;  Laterality: N/A;  . LIPOMA EXCISION  2019   lower right back  . POLYPECTOMY  03/08/2019   Procedure: POLYPECTOMY;  Surgeon: Lucilla Lame, MD;  Location: [facility];  Service: Endoscopy;;  . TONSILLECTOMY    . VASECTOMY  04/25/2019    Home Medications: Prior to Admission medications   Medication Sig Start Date End Date Taking? Authorizing Provider  Cholecalciferol (VITAMIN D3 PO) Take by mouth.   Yes [provider]  cyanocobalamin (,VITAMIN B-12,) 1000 MCG/ML injection inject 50mL IM once a week for four weeks then once a month for four months. 03/26/19  Yes [provider]  doxycycline (VIBRAMYCIN) 100 MG capsule Take 1 capsule (100 mg total) by mouth every 12 (twelve) hours. 07/09/19  Yes McGowan, Larene Beach A, PA-C  naproxen (NAPROSYN) 500 MG tablet Take 1 tablet (500 mg total) by mouth 2 (two) times daily with a meal. 07/08/19  Yes Lorin Picket, PA-C  topiramate (TOPAMAX) 25 MG tablet Take 100 mg by mouth daily. 03/21/19  Yes [provider]  Mesalamine (ASACOL) 400 MG CPDR DR capsule Take 2 capsules (800 mg total) by mouth 3 (three) times daily. 03/14/19 07/08/19  Lucilla Lame, MD    Allergies: No Known Allergies  Social History:  reports that he quit smoking about 2 years ago. His smoking use included cigarettes. He quit after 2.00 years of use. He has never used smokeless tobacco. He reports current alcohol use of about 3.0 standard drinks of alcohol per week. He reports that  he does not use drugs.   Family History: Family History  Problem Relation Age of Onset  . Crohn's disease Mother     Review of Systems: Review of Systems  Constitutional: Negative for chills and fever.  HENT: Negative for hearing loss.   Eyes: Negative for blurred vision.  Respiratory: Negative for shortness of breath.   Cardiovascular: Negative for chest pain.  Gastrointestinal: Positive for abdominal pain (right groin). Negative for blood in stool, constipation, diarrhea, nausea and vomiting.  Genitourinary: Negative for dysuria.       Initial right  testicular pain  Musculoskeletal: Negative for myalgias.  Skin: Negative for rash.  Neurological: Negative for dizziness.  Psychiatric/Behavioral: Negative for depression.    Physical Exam BP 125/84   Pulse 94   Temp 97.7 F (36.5 C) (Temporal)   Ht 5\' 10"  (1.778 m)   Wt 207 lb (93.9 kg)   SpO2 98%   BMI 29.70 kg/m  CONSTITUTIONAL: No acute distress HEENT:  Normocephalic, atraumatic, extraocular motion intact. NECK: Trachea is midline, and there is no jugular venous distension.  RESPIRATORY:  Lungs are clear, and breath sounds are equal bilaterally. Normal respiratory effort without pathologic use of accessory muscles. CARDIOVASCULAR: Heart is regular without murmurs, gallops, or rubs. GI: The abdomen is soft, non-distended, with discomfort/tenderness to palpation along the inguinal canal in the right groin.  On exam, there is a small right inguinal hernia, which is reducible.  There is no hernia palpable on the left side.  There is no umbilical hernia.  GU: bilateral scrotum without any swelling or induration.  There is no pain on palpation of the scrotum or testicles. MUSCULOSKELETAL:  Normal muscle strength and tone in all four extremities.  No peripheral edema or cyanosis. SKIN: Skin turgor is normal. There are no pathologic skin lesions.  NEUROLOGIC:  Motor and sensation is grossly normal.  Cranial nerves are grossly intact. PSYCH:  Alert and oriented to person, place and time. Affect is normal.  Laboratory Analysis: Urinalysis 07/09/19: Negative LE, Nitrite, no bacteria seen, negative for RBC.  Imaging: CT renal stone 07/09/19: IMPRESSION: 1. Small right inguinal hernia contains only fat. 2. No urinary stone disease. No secondary changes in either kidney or ureter.   Assessment and Plan: This is a 29 y.o. male with a new, symptomatic right inguinal hernia.  --Discussed with the patient that he does have a right inguinal hernia on exam as demonstrated on his CT scan.  The  hernia is easily reducible, but he does have discomfort when pushing up the external inguinal ring to palpate the hernia.  The scrotum and testicle do not show any symptoms, but he is tender in the groin area distally.   --Discussed with him that the soreness/pain may be related to irritation of the inguinal nerves from the initial hernia bulging/protrusion.  The nerves may be irritated with each time the hernia protrudes as well.   --Based on his symptoms and hernia finding, he would be a candidate for hernia repair.  Discussed with him the role for a laparoscopic right inguinal hernia repair.  Based on current scheduling timing, we could schedule his surgery for 2/23 or 2/24, and based on his work duties, he would have a weight lifting/pushing restriction for 6 weeks after surgery.  He is worried about what this would mean for his current job and the job he is trying to start.  I discussed with him that there is flexibility about timing of surgery if he wanted to wait  longer, but my worry is that resuming work will only aggravate the symptoms.  He is going to discuss with his job about timing and will give [place] a call with his decision about when to do surgery so we can schedule him.   --Discussed with him the risks of bleeding, infection, injury to surrounding structures, post-op recovery, pain control, activity restrictions, and he's willing to proceed when he's able to schedule.  He understands that he would need a COVID test prior to surgery.  Face-to-face time spent with the patient and care providers was 60 minutes, with more than 50% of the time spent counseling, educating, and coordinating care of the patient.     Melvyn Neth, [facility]

## 2019-07-19 ENCOUNTER — Emergency Department: Payer: No Typology Code available for payment source

## 2019-07-19 ENCOUNTER — Encounter: Payer: Self-pay | Admitting: Emergency Medicine

## 2019-07-19 ENCOUNTER — Telehealth: Payer: Self-pay | Admitting: *Deleted

## 2019-07-19 ENCOUNTER — Other Ambulatory Visit: Payer: Self-pay

## 2019-07-19 ENCOUNTER — Emergency Department
Admission: EM | Admit: 2019-07-19 | Discharge: 2019-07-19 | Disposition: A | Discharge status: Home / Self Care | Payer: No Typology Code available for payment source | Attending: Emergency Medicine | Admitting: Emergency Medicine

## 2019-07-19 DIAGNOSIS — Z87891 Personal history of nicotine dependence: Secondary | ICD-10-CM | POA: Insufficient documentation

## 2019-07-19 DIAGNOSIS — K4090 Unilateral inguinal hernia, without obstruction or gangrene, not specified as recurrent: Secondary | ICD-10-CM | POA: Diagnosis not present

## 2019-07-19 DIAGNOSIS — Z79899 Other long term (current) drug therapy: Secondary | ICD-10-CM | POA: Insufficient documentation

## 2019-07-19 DIAGNOSIS — R10813 Right lower quadrant abdominal tenderness: Secondary | ICD-10-CM | POA: Diagnosis present

## 2019-07-19 LAB — URINALYSIS, COMPLETE (UACMP) WITH MICROSCOPIC
Bacteria, UA: NONE SEEN
Bilirubin Urine: NEGATIVE
Glucose, UA: NEGATIVE mg/dL
Hgb urine dipstick: NEGATIVE
Ketones, ur: NEGATIVE mg/dL
Leukocytes,Ua: NEGATIVE
Nitrite: NEGATIVE
Protein, ur: NEGATIVE mg/dL
Specific Gravity, Urine: 1.005 (ref 1.005–1.030)
Squamous Epithelial / HPF: NONE SEEN (ref 0–5)
pH: 7 (ref 5.0–8.0)

## 2019-07-19 LAB — COMPREHENSIVE METABOLIC PANEL
ALT: 22 U/L (ref 0–44)
AST: 18 U/L (ref 15–41)
Albumin: 4.6 g/dL (ref 3.5–5.0)
Alkaline Phosphatase: 70 U/L (ref 38–126)
Anion gap: 9 (ref 5–15)
BUN: 15 mg/dL (ref 6–20)
CO2: 22 mmol/L (ref 22–32)
Calcium: 9.3 mg/dL (ref 8.9–10.3)
Chloride: 108 mmol/L (ref 98–111)
Creatinine, Ser: 1.13 mg/dL (ref 0.61–1.24)
GFR calc Af Amer: >60 mL/min (ref >60)
GFR calc non Af Amer: >60 mL/min (ref >60)
Glucose, Bld: 101 mg/dL — ABNORMAL HIGH (ref 70–99)
Potassium: 4 mmol/L (ref 3.5–5.1)
Sodium: 139 mmol/L (ref 135–145)
Total Bilirubin: 0.6 mg/dL (ref 0.3–1.2)
Total Protein: 7.7 g/dL (ref 6.5–8.1)

## 2019-07-19 LAB — CBC WITH DIFFERENTIAL/PLATELET
Abs Immature Granulocytes: 0.02 10*3/uL (ref 0.00–0.07)
Basophils Absolute: 0.1 10*3/uL (ref 0.0–0.1)
Basophils Relative: 1 %
Eosinophils Absolute: 0.4 10*3/uL (ref 0.0–0.5)
Eosinophils Relative: 7 %
HCT: 44.2 % (ref 39.0–52.0)
Hemoglobin: 14.8 g/dL (ref 13.0–17.0)
Immature Granulocytes: 0 %
Lymphocytes Relative: 38 %
Lymphs Abs: 2.5 10*3/uL (ref 0.7–4.0)
MCH: 29.2 pg (ref 26.0–34.0)
MCHC: 33.5 g/dL (ref 30.0–36.0)
MCV: 87.2 fL (ref 80.0–100.0)
Monocytes Absolute: 0.7 10*3/uL (ref 0.1–1.0)
Monocytes Relative: 11 %
Neutro Abs: 2.8 10*3/uL (ref 1.7–7.7)
Neutrophils Relative %: 43 %
Platelets: 219 10*3/uL (ref 150–400)
RBC: 5.07 MIL/uL (ref 4.22–5.81)
RDW: 12.9 % (ref 11.5–15.5)
WBC: 6.5 10*3/uL (ref 4.0–10.5)
nRBC: 0 % (ref 0.0–0.2)

## 2019-07-19 LAB — LIPASE, BLOOD: Lipase: 32 U/L (ref 11–51)

## 2019-07-19 LAB — POST-VAS SPERM EVALUATION,QUAL: Volume: 1.8 mL

## 2019-07-19 MED ORDER — ONDANSETRON HCL 4 MG/2ML IJ SOLN
4.0000 mg | Freq: Once | INTRAMUSCULAR | Status: AC
  Administered 2019-07-19: 4 mg via INTRAVENOUS
  Filled 2019-07-19: qty 2

## 2019-07-19 MED ORDER — MORPHINE SULFATE (PF) 2 MG/ML IV SOLN
2.0000 mg | Freq: Once | INTRAVENOUS | Status: AC
  Administered 2019-07-19: 2 mg via INTRAVENOUS
  Filled 2019-07-19: qty 1

## 2019-07-19 MED ORDER — IOHEXOL 300 MG/ML  SOLN
100.0000 mL | Freq: Once | INTRAMUSCULAR | Status: AC | PRN
  Administered 2019-07-19: 100 mL via INTRAVENOUS

## 2019-07-19 MED ORDER — OXYCODONE-ACETAMINOPHEN 5-325 MG PO TABS: 1.0000 | ORAL_TABLET | ORAL | 0 refills | Status: DC | PRN

## 2019-07-19 NOTE — Addendum Note (Signed)
Addended by: Riki Sheer on: 07/19/2019 11:59 AM   Modules accepted: Orders, SmartSet

## 2019-07-19 NOTE — ED Triage Notes (Signed)
Patient ambulatory to triage with steady gait, without difficulty or distress noted; pt reports dx with inguinal hernia yesterday; having increased rt groin pain accomp by urinary urgency and nausea

## 2019-07-19 NOTE — Telephone Encounter (Signed)
Patient was seen on 07/17/19 by Dr Hampton Abbot for right inguinal hernia and was not going to schedule surgery yet but patient ended up going back to the emergency room with pain in that area and he is now ready to get surgery set up for surgery. Please call and advise

## 2019-07-19 NOTE — ED Provider Notes (Signed)
[facility] Emergency Department Provider Note  ____________________________________________   First MD Initiated Contact with Patient 07/19/19 [phone]     (approximate)  I have reviewed the triage vital signs and the nursing notes.   HISTORY  Chief Complaint Groin Pain    HPI Tony [name] is a 29 y.o. male with below list of previous medical conditions including recently diagnosed right inguinal hernia presents to the emergency department secondary to acute onset of 10 out of 10 right inguinal pain at the site of the hernia per the patient.  Patient states that symptoms began last night and has been persistent since onset.  Current pain score of 9 out of 10.  Patient admits to nausea but no vomiting.  Patient denies any diarrhea constipation.  Patient does admit to urinary urgency however no dysuria        Past Medical History:  Diagnosis Date  . Headache    multiple/day.  . Seizures [facility])     Patient Active Problem List   Diagnosis Date Noted  . Change in bowel function   . Seizure-like activity ([place]) 02/08/2019  . Inflammatory bowel disease 02/08/2019  . Back muscle spasm 02/08/2019  . Depression 02/08/2019    Past Surgical History:  Procedure Laterality Date  . COLONOSCOPY WITH PROPOFOL N/A 03/08/2019   Procedure: COLONOSCOPY WITH PROPOFOL;  Surgeon: Lucilla Lame, MD;  Location: [place];  Service: Endoscopy;  Laterality: N/A;  . LIPOMA EXCISION  2019   lower right back  . POLYPECTOMY  03/08/2019   Procedure: POLYPECTOMY;  Surgeon: Lucilla Lame, MD;  Location: [place];  Service: Endoscopy;;  . TONSILLECTOMY    . VASECTOMY  04/25/2019    Prior to Admission medications   Medication Sig Start Date End Date Taking? Authorizing Provider  Cholecalciferol (VITAMIN D3 PO) Take by mouth.    [provider]  cyanocobalamin (,VITAMIN B-12,) 1000 MCG/ML injection inject 94mL IM once a week for four weeks then once a  month for four months. 03/26/19   [provider]  doxycycline (VIBRAMYCIN) 100 MG capsule Take 1 capsule (100 mg total) by mouth every 12 (twelve) hours. 07/09/19   Zara Council A, PA-C  naproxen (NAPROSYN) 500 MG tablet Take 1 tablet (500 mg total) by mouth 2 (two) times daily with a meal. 07/08/19   Lorin Picket, PA-C  oxyCODONE-acetaminophen (PERCOCET) 5-325 MG tablet Take 1 tablet by mouth every 4 (four) hours as needed. 07/19/19 07/18/20  Gregor Hams, MD  topiramate (TOPAMAX) 25 MG tablet Take 100 mg by mouth daily. 03/21/19   [provider]  Mesalamine (ASACOL) 400 MG CPDR DR capsule Take 2 capsules (800 mg total) by mouth 3 (three) times daily. 03/14/19 07/08/19  Lucilla Lame, MD    Allergies Patient has no known allergies.  Family History  Problem Relation Age of Onset  . Crohn's disease Mother     Social History Social History   Tobacco Use  . Smoking status: Former Smoker    Years: 2.00    Types: Cigarettes    Quit date: 02/2017    Years since quitting: 2.4  . Smokeless tobacco: Never Used  . Tobacco comment: 1 pack per month  Substance Use Topics  . Alcohol use: Yes    Alcohol/week: 3.0 standard drinks    Types: 3 Cans of beer per week    Comment:    . Drug use: Never    Review of Systems Constitutional: No fever/chills Eyes: No  visual changes. ENT: No sore throat. Cardiovascular: Denies chest pain. Respiratory: Denies shortness of breath. Gastrointestinal: Positive for abdominal pain and nausea no vomiting.  No diarrhea.  No constipation. Genitourinary: Negative for dysuria. Musculoskeletal: Negative for neck pain.  Negative for back pain. Integumentary: Negative for rash. Neurological: Negative for headaches, focal weakness or numbness.   ____________________________________________   PHYSICAL EXAM:  VITAL SIGNS: ED Triage Vitals  Enc Vitals Group     BP 07/19/19 0148 134/76     Pulse Rate 07/19/19 0148 85     Resp  07/19/19 0148 20     Temp 07/19/19 0148 98.1 F (36.7 C)     Temp Source 07/19/19 0148 Oral     SpO2 07/19/19 0148 97 %     Weight 07/19/19 0155 95.3 kg (210 lb)     Height 07/19/19 0155 1.778 m (5\' 10" )     Head Circumference --      Peak Flow --      Pain Score 07/19/19 0155 9     Pain Loc --      Pain Edu? --      Excl. in [place]? --     Constitutional: Alert and oriented.  Eyes: Conjunctivae are normal.  Mouth/Throat: Patient is wearing a mask. Neck: No stridor.  No meningeal signs.   Cardiovascular: Normal rate, regular rhythm. Good peripheral circulation. Grossly normal heart sounds. Respiratory: Normal respiratory effort.  No retractions. Gastrointestinal: Soft and nontender. No distention.  Pain with palpation right pelvic region/inguinal region Musculoskeletal: No lower extremity tenderness nor edema. No gross deformities of extremities. Neurologic:  Normal speech and language. No gross focal neurologic deficits are appreciated.  Skin:  Skin is warm, dry and intact. Psychiatric: Mood and affect are normal. Speech and behavior are normal.  ____________________________________________   LABS (all labs ordered are listed, but only abnormal results are displayed)  Labs Reviewed  URINALYSIS, COMPLETE (UACMP) WITH MICROSCOPIC - Abnormal; Notable for the following components:      Result Value   Color, Urine STRAW (*)    APPearance CLEAR (*)    All other components within normal limits  COMPREHENSIVE METABOLIC PANEL - Abnormal; Notable for the following components:   Glucose, Bld 101 (*)    All other components within normal limits  CBC WITH DIFFERENTIAL/PLATELET  LIPASE, BLOOD   __________  RADIOLOGY I, [place] Mousa Prout, personally viewed and evaluated these images (plain radiographs) as part of my medical decision making, as well as reviewing the written report by the radiologist.  ED MD interpretation: CT abdomen pelvis revealed no acute findings no appendicitis or  ureterolithiasis.  Official radiology report(s): CT ABDOMEN PELVIS W CONTRAST  Result Date: 07/19/2019 CLINICAL DATA:  Right lower quadrant pain EXAM: CT ABDOMEN AND PELVIS WITH CONTRAST TECHNIQUE: Multidetector CT imaging of the abdomen and pelvis was performed using the standard protocol following bolus administration of intravenous contrast. CONTRAST:  194mL OMNIPAQUE IOHEXOL 300 MG/ML  SOLN COMPARISON:  Ten days ago FINDINGS: Lower chest:  No contributory findings. Hepatobiliary: 11 mm low-density lesion in the subcapsular right lobe. Subcentimeter low-density foci in the more inferior right liver and lateral segment left liver. These do not measure cystic density. In the largest lesion there is question of peripheral nodular enhancement.No evidence of biliary obstruction or stone. Pancreas: Unremarkable. Spleen: Unremarkable. Adrenals/Urinary Tract: Negative adrenals. No hydronephrosis or stone. Unremarkable bladder. Stomach/Bowel:  No obstruction. No appendicitis. Vascular/Lymphatic: No acute vascular abnormality. No mass or adenopathy. Reproductive:No pathologic findings. Other: No ascites  or pneumoperitoneum. Musculoskeletal: No acute abnormalities. IMPRESSION: 1. No acute finding.  No appendicitis or urolithiasis. 2. 3 small liver lesions measuring up to 11 mm in the right lobe, possible hemangiomas but characterization is limited by small size. If there is history of malignancy or chronic liver disease elective MRI would be recommended. Otherwise consider 6 month follow-up using CT, MRI, or ultrasound to document stability. Electronically Signed   By: Monte Fantasia M.D.   On: 07/19/2019 06:44      Procedures   ____________________________________________   INITIAL IMPRESSION / MDM / ASSESSMENT AND PLAN / ED COURSE  As part of my medical decision making, I reviewed the following data within the electronic MEDICAL RECORD NUMBER  29 year old male presented with above-stated history and  physical exam concerning for incarcerated inguinal hernia versus appendicitis and as such CT abdomen pelvis performed which revealed no acute pathology.  Patient referred to follow-up with Dr. Hampton Abbot general surgery as planned regarding his inguinal hernia       ____________________________________________  FINAL CLINICAL IMPRESSION(S) / ED DIAGNOSES  Final diagnoses:  Right inguinal hernia     MEDICATIONS GIVEN DURING THIS VISIT:  Medications  morphine 2 MG/ML injection 2 mg (2 mg Intravenous Given 07/19/19 0558)  ondansetron (ZOFRAN) injection 4 mg (4 mg Intravenous Given 07/19/19 0558)  iohexol (OMNIPAQUE) 300 MG/ML solution 100 mL (100 mLs Intravenous Contrast Given 07/19/19 [id number])     ED Discharge Orders         Ordered    oxyCODONE-acetaminophen (PERCOCET) 5-325 MG tablet  Every 4 hours PRN     07/19/19 0703          *Please note:  Cray Prew was evaluated in Emergency Department on 07/19/2019 for the symptoms described in the history of present illness. He was evaluated in the context of the global COVID-19 pandemic, which necessitated consideration that the patient might be at risk for infection with the SARS-CoV-2 virus that causes COVID-19. Institutional protocols and algorithms that pertain to the evaluation of patients at risk for COVID-19 are in a state of rapid change based on information released by regulatory bodies including the CDC and federal and state organizations. These policies and algorithms were followed during the patient's care in the ED.  Some ED evaluations and interventions may be delayed as a result of limited staffing during the pandemic.*  Note:  This document was prepared using Dragon voice recognition software and may include unintentional dictation errors.   Gregor Hams, MD 07/19/19 [id number]

## 2019-07-22 ENCOUNTER — Telehealth: Payer: Self-pay | Admitting: *Deleted

## 2019-07-22 NOTE — Telephone Encounter (Signed)
Pt has been advised of pre admission date/time, Covid Testing date and Surgery date.  Surgery Date: 07/30/19 Preadmission Testing Date: 07/25/19 (8a-1p) Covid Testing Date: 07/26/19 - patient advised to go to the [facility]  Patient has been made aware to call [phone], between 1-3:00pm the day before surgery, to find out what time to arrive.    His wife will drop by the office to pick up the surgical packet that will be waiting @ the front office for pick up.

## 2019-07-22 NOTE — Telephone Encounter (Signed)
Notified patient as instructed, patient pleased °

## 2019-07-22 NOTE — Telephone Encounter (Signed)
-----   Message from Abbie Sons, MD sent at 07/22/2019  9:04 AM EST ----- Postvasectomy semen sample showed no sperm.  Okay to use vasectomy as primary contraception

## 2019-07-25 ENCOUNTER — Encounter
Admission: RE | Admit: 2019-07-25 | Discharge: 2019-07-25 | Disposition: A | Discharge status: Home / Self Care | Payer: 59 | Source: Ambulatory Visit | Attending: Surgery | Admitting: Surgery

## 2019-07-25 ENCOUNTER — Other Ambulatory Visit: Payer: Self-pay

## 2019-07-25 DIAGNOSIS — Z01818 Encounter for other preprocedural examination: Secondary | ICD-10-CM | POA: Insufficient documentation

## 2019-07-25 HISTORY — DX: Noninfective gastroenteritis and colitis, unspecified: K52.9

## 2019-07-25 HISTORY — DX: Anemia, unspecified: D64.9

## 2019-07-25 HISTORY — DX: Benign neoplasm of colon, unspecified: D12.6

## 2019-07-25 NOTE — Patient Instructions (Addendum)
INSTRUCTIONS FOR SURGERY     Your surgery is scheduled for:   Tuesday, February 23RD     To find out your arrival time for the day of surgery,          please call [phone] between 1 pm and 3 pm on :  Monday, February 22ND     When you arrive for surgery, report to the [facility].       Do NOT stop on the first floor to register.    REMEMBER: Instructions that are not followed completely may result in serious medical risk,  up to and including death, or upon the discretion of your surgeon and anesthesiologist,            your surgery may need to be rescheduled.  __X__ 1. Do not eat food after midnight the night before your procedure.                    No gum, candy, lozenger, tic tacs, tums or hard candies.                  ABSOLUTELY NOTHING SOLID IN YOUR MOUTH AFTER MIDNIGHT                    You may drink unlimited clear liquids up to 2 hours before you are scheduled to arrive for surgery.                   Do not drink anything within those 2 hours unless you need to take medicine, then take the                   smallest amount you need.  Clear liquids include:  water, apple juice without pulp,                   any flavor Gatorade, Black coffee, black tea.  Sugar may be added but no dairy/ honey /lemon.                        Broth and jello is not considered a clear liquid.  __x__  2. On the morning of surgery, please brush your teeth with toothpaste and water. You may rinse with                  mouthwash if you wish but DO NOT SWALLOW TOOTHPASTE OR MOUTHWASH  __X___3. NO alcohol for 24 hours before or after surgery.  __x___ 4.  Do NOT smoke or use e-cigarettes for 24 HOURS PRIOR TO SURGERY.                      DO NOT Use any chewable tobacco products for at least 6 hours prior to surgery.  __x___ 5. If you start any new medication after this appointment and prior to surgery, please          Bring it with you on the day of surgery.  ___x__ 6. Notify your doctor if there is any change in your medical condition, such as fever,  infection, vomiting,  diarrhea or any open sores.  __x___ 7.  USE the CHG SOAP as instructed, the night before surgery and the day of surgery.                   Once you have washed with this soap, do NOT use any of the following: Powders, perfumes                    or lotions. Please do not wear make up, hairpins, clips or nail polish. You may wear deodorant.                   Men may shave their face and neck.  Women need to shave 48 hours prior to surgery.                   DO NOT wear ANY jewelry on the day of surgery. If there are rings that are too tight to                    remove easily, please address this prior to the surgery day. Piercings need to be removed.                                                                     NO METAL ON YOUR BODY.                    Do NOT bring any valuables.  If you came to Pre-Admit testing then you will not need license,                     insurance card or credit card.  If you will be staying overnight, please either leave your things in                     the car or have your family be responsible for these items.                     [facility] IS NOT RESPONSIBLE FOR BELONGINGS OR VALUABLES.  ___X__ 8. DO NOT wear contact lenses on surgery day.  You may not have dentures,                     Hearing aides, contacts or glasses in the operating room. These items can be                    Placed in the Recovery Room to receive immediately after surgery.  __x___ 9. IF YOU ARE SCHEDULED TO GO HOME ON THE SAME DAY, YOU MUST                   Have someone to drive you home and to stay with you  for the first 24 hours.                    Have an arrangement prior to arriving on surgery day.  ___x__ 10. Take the following medications on the morning of surgery with a sip of water:  1.  PERCOCET IF NEEDED                     2.                     3.                                                   _____ 11.  Follow any instructions provided to you by your surgeon.                        Such as enema, clear liquid bowel prep  __X__  12. STOP  ASPIRIN AS OF: Thursday, February 18TH                       THIS INCLUDES BC POWDERS / GOODIES POWDER  __x___ 13. STOP Anti-inflammatories as of:  Thursday, February 18TH                      This includes IBUPROFEN / MOTRIN / ADVIL / ALEVE/ NAPROXYN                    YOU MAY TAKE TYLENOL ANY TIME PRIOR TO SURGERY.  _____ 14.  You may continue taking Vitamin B12 / Vitamin D3 but do not take on the morning of surgery.  ______17.  Continue to take the following medications but do not take on the morning of surgery:                     Wear clean and comfortable clothing to the hospital.  [place]. PLEASE START STOOL SOFTENERS ON Sunday. READ THE MEDICAL ADVANCE DIRECTIVES AND COMPLETE WHEN YOU ARE READY.    IF YOU HAVE IT NOTARIZED PRIOR TO  YOUR SURGERY, PLEASE BRING WITH YOU SO WE      MAY MAKE A COPY FOR YOUR CHART.

## 2019-07-26 ENCOUNTER — Other Ambulatory Visit
Admission: RE | Admit: 2019-07-26 | Discharge: 2019-07-26 | Disposition: A | Discharge status: Home / Self Care | Payer: 59 | Source: Ambulatory Visit | Attending: Surgery | Admitting: Surgery

## 2019-07-26 DIAGNOSIS — Z20822 Contact with and (suspected) exposure to covid-19: Secondary | ICD-10-CM | POA: Insufficient documentation

## 2019-07-26 DIAGNOSIS — Z01812 Encounter for preprocedural laboratory examination: Secondary | ICD-10-CM | POA: Diagnosis present

## 2019-07-26 LAB — SARS CORONAVIRUS 2 (TAT 6-24 HRS): SARS Coronavirus 2: NEGATIVE

## 2019-07-29 MED ORDER — CEFAZOLIN SODIUM-DEXTROSE 2-4 GM/100ML-% IV SOLN
2.0000 g | INTRAVENOUS | Status: AC
  Administered 2019-07-30: 12:00:00 2 g via INTRAVENOUS

## 2019-07-30 ENCOUNTER — Ambulatory Visit: Payer: No Typology Code available for payment source | Admitting: Anesthesiology

## 2019-07-30 ENCOUNTER — Other Ambulatory Visit: Payer: Self-pay

## 2019-07-30 ENCOUNTER — Encounter: Payer: Self-pay | Admitting: Surgery

## 2019-07-30 ENCOUNTER — Ambulatory Visit
Admission: RE | Admit: 2019-07-30 | Discharge: 2019-07-30 | Disposition: A | Discharge status: Home / Self Care | Payer: No Typology Code available for payment source | Attending: Surgery | Admitting: Surgery

## 2019-07-30 ENCOUNTER — Encounter
Admission: RE | Disposition: A | Discharge status: Home / Self Care | Payer: Self-pay | Source: Home / Self Care | Attending: Surgery

## 2019-07-30 DIAGNOSIS — Z791 Long term (current) use of non-steroidal anti-inflammatories (NSAID): Secondary | ICD-10-CM | POA: Diagnosis not present

## 2019-07-30 DIAGNOSIS — E559 Vitamin D deficiency, unspecified: Secondary | ICD-10-CM | POA: Insufficient documentation

## 2019-07-30 DIAGNOSIS — K4090 Unilateral inguinal hernia, without obstruction or gangrene, not specified as recurrent: Secondary | ICD-10-CM | POA: Diagnosis present

## 2019-07-30 DIAGNOSIS — R569 Unspecified convulsions: Secondary | ICD-10-CM | POA: Diagnosis not present

## 2019-07-30 DIAGNOSIS — Z79899 Other long term (current) drug therapy: Secondary | ICD-10-CM | POA: Insufficient documentation

## 2019-07-30 DIAGNOSIS — D176 Benign lipomatous neoplasm of spermatic cord: Secondary | ICD-10-CM | POA: Diagnosis not present

## 2019-07-30 DIAGNOSIS — E538 Deficiency of other specified B group vitamins: Secondary | ICD-10-CM | POA: Diagnosis not present

## 2019-07-30 DIAGNOSIS — Z87891 Personal history of nicotine dependence: Secondary | ICD-10-CM | POA: Insufficient documentation

## 2019-07-30 HISTORY — PX: INGUINAL HERNIA REPAIR: SHX194

## 2019-07-30 SURGERY — REPAIR, HERNIA, INGUINAL, LAPAROSCOPIC
Anesthesia: General | Laterality: Right

## 2019-07-30 MED ORDER — BUPIVACAINE LIPOSOME 1.3 % IJ SUSP
INTRAMUSCULAR | Status: DC | PRN
  Administered 2019-07-30: 20 mL

## 2019-07-30 MED ORDER — SUGAMMADEX SODIUM 200 MG/2ML IV SOLN
INTRAVENOUS | Status: AC
  Filled 2019-07-30: qty 2

## 2019-07-30 MED ORDER — ROCURONIUM BROMIDE 50 MG/5ML IV SOLN
INTRAVENOUS | Status: AC
  Filled 2019-07-30: qty 1

## 2019-07-30 MED ORDER — GABAPENTIN 300 MG PO CAPS: 300.0000 mg | ORAL_CAPSULE | ORAL | Status: AC

## 2019-07-30 MED ORDER — FENTANYL CITRATE (PF) 100 MCG/2ML IJ SOLN
INTRAMUSCULAR | Status: AC
  Filled 2019-07-30: qty 2

## 2019-07-30 MED ORDER — EPINEPHRINE PF 1 MG/ML IJ SOLN
INTRAMUSCULAR | Status: AC
  Filled 2019-07-30: qty 1

## 2019-07-30 MED ORDER — PROPOFOL 10 MG/ML IV BOLUS
INTRAVENOUS | Status: AC
  Filled 2019-07-30: qty 20

## 2019-07-30 MED ORDER — LACTATED RINGERS IV SOLN: INTRAVENOUS | Status: DC

## 2019-07-30 MED ORDER — HYDROMORPHONE HCL 1 MG/ML IJ SOLN
INTRAMUSCULAR | Status: DC | PRN
  Administered 2019-07-30: .6 mg via INTRAVENOUS

## 2019-07-30 MED ORDER — OXYCODONE HCL 5 MG PO TABS: 5.0000 mg | ORAL_TABLET | ORAL | 0 refills | Status: DC | PRN

## 2019-07-30 MED ORDER — KETOROLAC TROMETHAMINE 30 MG/ML IJ SOLN
INTRAMUSCULAR | Status: DC | PRN
  Administered 2019-07-30: 30 mg via INTRAVENOUS

## 2019-07-30 MED ORDER — PROPOFOL 10 MG/ML IV BOLUS
INTRAVENOUS | Status: DC | PRN
  Administered 2019-07-30: 200 mg via INTRAVENOUS

## 2019-07-30 MED ORDER — KETAMINE HCL 50 MG/ML IJ SOLN
INTRAMUSCULAR | Status: DC | PRN
  Administered 2019-07-30: 40 mg via INTRAMUSCULAR

## 2019-07-30 MED ORDER — BUPIVACAINE LIPOSOME 1.3 % IJ SUSP
INTRAMUSCULAR | Status: AC
  Filled 2019-07-30: qty 20

## 2019-07-30 MED ORDER — PROPOFOL 500 MG/50ML IV EMUL
INTRAVENOUS | Status: AC
  Filled 2019-07-30: qty 50

## 2019-07-30 MED ORDER — MIDAZOLAM HCL 2 MG/2ML IJ SOLN
INTRAMUSCULAR | Status: DC | PRN
  Administered 2019-07-30: 2 mg via INTRAVENOUS

## 2019-07-30 MED ORDER — LACTATED RINGERS IV SOLN: INTRAVENOUS | Status: DC | PRN

## 2019-07-30 MED ORDER — DEXMEDETOMIDINE HCL IN NACL 80 MCG/20ML IV SOLN
INTRAVENOUS | Status: AC
  Filled 2019-07-30: qty 20

## 2019-07-30 MED ORDER — LIDOCAINE HCL (CARDIAC) PF 100 MG/5ML IV SOSY
PREFILLED_SYRINGE | INTRAVENOUS | Status: DC | PRN
  Administered 2019-07-30: 100 mg via INTRAVENOUS

## 2019-07-30 MED ORDER — HYDROMORPHONE HCL 1 MG/ML IJ SOLN
INTRAMUSCULAR | Status: AC
  Filled 2019-07-30: qty 1

## 2019-07-30 MED ORDER — PHENYLEPHRINE HCL (PRESSORS) 10 MG/ML IV SOLN
INTRAVENOUS | Status: DC | PRN
  Administered 2019-07-30: 50 ug via INTRAVENOUS
  Administered 2019-07-30: 100 ug via INTRAVENOUS
  Administered 2019-07-30: 150 ug via INTRAVENOUS
  Administered 2019-07-30: 100 ug via INTRAVENOUS
  Administered 2019-07-30: 200 ug via INTRAVENOUS

## 2019-07-30 MED ORDER — DEXMEDETOMIDINE HCL 200 MCG/2ML IV SOLN
INTRAVENOUS | Status: DC | PRN
  Administered 2019-07-30: 8 ug via INTRAVENOUS
  Administered 2019-07-30: 16 ug via INTRAVENOUS
  Administered 2019-07-30: 12 ug via INTRAVENOUS

## 2019-07-30 MED ORDER — KETOROLAC TROMETHAMINE 30 MG/ML IJ SOLN
INTRAMUSCULAR | Status: AC
  Filled 2019-07-30: qty 1

## 2019-07-30 MED ORDER — EPHEDRINE SULFATE 50 MG/ML IJ SOLN
INTRAMUSCULAR | Status: DC | PRN
  Administered 2019-07-30: 10 mg via INTRAVENOUS

## 2019-07-30 MED ORDER — DEXAMETHASONE SODIUM PHOSPHATE 10 MG/ML IJ SOLN
INTRAMUSCULAR | Status: AC
  Filled 2019-07-30: qty 1

## 2019-07-30 MED ORDER — HYDROMORPHONE HCL 1 MG/ML IJ SOLN
INTRAMUSCULAR | Status: AC
  Administered 2019-07-30: 0.25 mg via INTRAVENOUS
  Filled 2019-07-30: qty 1

## 2019-07-30 MED ORDER — SUGAMMADEX SODIUM 200 MG/2ML IV SOLN
INTRAVENOUS | Status: DC | PRN
  Administered 2019-07-30: 200 mg via INTRAVENOUS

## 2019-07-30 MED ORDER — ACETAMINOPHEN 500 MG PO TABS
ORAL_TABLET | ORAL | Status: AC
  Administered 2019-07-30: 11:00:00 1000 mg via ORAL
  Filled 2019-07-30: qty 2

## 2019-07-30 MED ORDER — FENTANYL CITRATE (PF) 100 MCG/2ML IJ SOLN
25.0000 ug | INTRAMUSCULAR | Status: DC | PRN
  Administered 2019-07-30: 16:00:00 50 ug via INTRAVENOUS

## 2019-07-30 MED ORDER — BUPIVACAINE HCL (PF) 0.5 % IJ SOLN
INTRAMUSCULAR | Status: AC
  Filled 2019-07-30: qty 30

## 2019-07-30 MED ORDER — IBUPROFEN 600 MG PO TABS: 600.0000 mg | ORAL_TABLET | Freq: Three times a day (TID) | ORAL | 1 refills | Status: DC | PRN

## 2019-07-30 MED ORDER — PROMETHAZINE HCL 25 MG/ML IJ SOLN: 6.2500 mg | INTRAMUSCULAR | Status: DC | PRN

## 2019-07-30 MED ORDER — ROCURONIUM BROMIDE 100 MG/10ML IV SOLN
INTRAVENOUS | Status: DC | PRN
  Administered 2019-07-30: 50 mg via INTRAVENOUS
  Administered 2019-07-30 (×2): 20 mg via INTRAVENOUS

## 2019-07-30 MED ORDER — CHLORHEXIDINE GLUCONATE CLOTH 2 % EX PADS
6.0000 | MEDICATED_PAD | Freq: Once | CUTANEOUS | Status: AC
  Administered 2019-07-30: 6 via TOPICAL

## 2019-07-30 MED ORDER — FENTANYL CITRATE (PF) 100 MCG/2ML IJ SOLN
INTRAMUSCULAR | Status: DC | PRN
  Administered 2019-07-30 (×2): 50 ug via INTRAVENOUS

## 2019-07-30 MED ORDER — FENTANYL CITRATE (PF) 100 MCG/2ML IJ SOLN
INTRAMUSCULAR | Status: AC
  Administered 2019-07-30: 16:00:00 50 ug via INTRAVENOUS
  Filled 2019-07-30: qty 2

## 2019-07-30 MED ORDER — ACETAMINOPHEN 500 MG PO TABS: 1000.0000 mg | ORAL_TABLET | ORAL | Status: AC

## 2019-07-30 MED ORDER — BUPIVACAINE-EPINEPHRINE (PF) 0.5% -1:200000 IJ SOLN
INTRAMUSCULAR | Status: DC | PRN
  Administered 2019-07-30: 30 mL

## 2019-07-30 MED ORDER — KETAMINE HCL 50 MG/ML IJ SOLN
INTRAMUSCULAR | Status: AC
  Filled 2019-07-30: qty 10

## 2019-07-30 MED ORDER — SUCCINYLCHOLINE CHLORIDE 20 MG/ML IJ SOLN
INTRAMUSCULAR | Status: AC
  Filled 2019-07-30: qty 1

## 2019-07-30 MED ORDER — HYDROMORPHONE HCL 1 MG/ML IJ SOLN
0.2500 mg | INTRAMUSCULAR | Status: DC | PRN
  Administered 2019-07-30 (×2): 0.25 mg via INTRAVENOUS

## 2019-07-30 MED ORDER — FAMOTIDINE 20 MG PO TABS: 20.0000 mg | ORAL_TABLET | Freq: Once | ORAL | Status: AC

## 2019-07-30 MED ORDER — FAMOTIDINE 20 MG PO TABS
ORAL_TABLET | ORAL | Status: AC
  Administered 2019-07-30: 11:00:00 20 mg via ORAL
  Filled 2019-07-30: qty 1

## 2019-07-30 MED ORDER — BUPIVACAINE LIPOSOME 1.3 % IJ SUSP: 20.0000 mL | Freq: Once | INTRAMUSCULAR | Status: DC

## 2019-07-30 MED ORDER — GABAPENTIN 300 MG PO CAPS
ORAL_CAPSULE | ORAL | Status: AC
  Administered 2019-07-30: 11:00:00 300 mg via ORAL
  Filled 2019-07-30: qty 1

## 2019-07-30 MED ORDER — CEFAZOLIN SODIUM-DEXTROSE 2-4 GM/100ML-% IV SOLN
INTRAVENOUS | Status: AC
  Filled 2019-07-30: qty 100

## 2019-07-30 MED ORDER — ONDANSETRON HCL 4 MG/2ML IJ SOLN
INTRAMUSCULAR | Status: AC
  Filled 2019-07-30: qty 2

## 2019-07-30 MED ORDER — NEOSTIGMINE METHYLSULFATE 10 MG/10ML IV SOLN
INTRAVENOUS | Status: DC | PRN
  Administered 2019-07-30: 2 mg via INTRAVENOUS

## 2019-07-30 MED ORDER — MIDAZOLAM HCL 2 MG/2ML IJ SOLN
INTRAMUSCULAR | Status: AC
  Filled 2019-07-30: qty 2

## 2019-07-30 MED ORDER — FENTANYL CITRATE (PF) 100 MCG/2ML IJ SOLN
INTRAMUSCULAR | Status: AC
  Administered 2019-07-30: 17:00:00 50 ug via INTRAVENOUS
  Filled 2019-07-30: qty 2

## 2019-07-30 MED ORDER — DEXAMETHASONE SODIUM PHOSPHATE 10 MG/ML IJ SOLN
INTRAMUSCULAR | Status: DC | PRN
  Administered 2019-07-30: 10 mg via INTRAVENOUS

## 2019-07-30 MED ORDER — ONDANSETRON HCL 4 MG/2ML IJ SOLN
INTRAMUSCULAR | Status: DC | PRN
  Administered 2019-07-30: 4 mg via INTRAVENOUS

## 2019-07-30 SURGICAL SUPPLY — 42 items
CHLORAPREP W/TINT 26 (MISCELLANEOUS) ×2 IMPLANT
COVER WAND RF STERILE (DRAPES) ×2 IMPLANT
DEFOGGER SCOPE WARMER CLEARIFY (MISCELLANEOUS) ×2 IMPLANT
DERMABOND ADVANCED (GAUZE/BANDAGES/DRESSINGS) ×1
DERMABOND ADVANCED .7 DNX12 (GAUZE/BANDAGES/DRESSINGS) ×1 IMPLANT
ELECT REM PT RETURN 9FT ADLT (ELECTROSURGICAL) ×2
ELECTRODE REM PT RTRN 9FT ADLT (ELECTROSURGICAL) ×1 IMPLANT
GLOVE SURG SYN 7.0 (GLOVE) ×6 IMPLANT
GLOVE SURG SYN 7.0 PF PI (GLOVE) ×1 IMPLANT
GLOVE SURG SYN 7.5  E (GLOVE) ×3
GLOVE SURG SYN 7.5 E (GLOVE) ×3 IMPLANT
GLOVE SURG SYN 7.5 PF PI (GLOVE) ×1 IMPLANT
GOWN STRL REUS W/ TWL LRG LVL3 (GOWN DISPOSABLE) ×2 IMPLANT
GOWN STRL REUS W/TWL LRG LVL3 (GOWN DISPOSABLE) ×4
IRRIGATION STRYKERFLOW (MISCELLANEOUS) IMPLANT
IRRIGATOR STRYKERFLOW (MISCELLANEOUS)
IV NS 1000ML (IV SOLUTION) ×1
IV NS 1000ML BAXH (IV SOLUTION) ×1 IMPLANT
KIT TURNOVER KIT A (KITS) ×2 IMPLANT
LABEL OR SOLS (LABEL) ×2 IMPLANT
MESH 3DMAX 3X5 RT MED (Mesh General) ×1 IMPLANT
NEEDLE HYPO 22GX1.5 SAFETY (NEEDLE) ×2 IMPLANT
NS IRRIG 500ML POUR BTL (IV SOLUTION) ×2 IMPLANT
PACK LAP CHOLECYSTECTOMY (MISCELLANEOUS) ×2 IMPLANT
PENCIL ELECTRO HAND CTR (MISCELLANEOUS) ×2 IMPLANT
SCISSORS METZENBAUM CVD 33 (INSTRUMENTS) ×2 IMPLANT
SET TUBE SMOKE EVAC HIGH FLOW (TUBING) ×2 IMPLANT
SLEEVE ADV FIXATION 5X100MM (TROCAR) ×4 IMPLANT
SUT MNCRL 4-0 (SUTURE) ×2
SUT MNCRL 4-0 27XMFL (SUTURE) ×2
SUT VIC AB 3-0 SH 27 (SUTURE) ×1
SUT VIC AB 3-0 SH 27X BRD (SUTURE) IMPLANT
SUT VICRYL 0 AB UR-6 (SUTURE) ×2 IMPLANT
SUT VLOC 90 S/L VL9 GS22 (SUTURE) ×3 IMPLANT
SUTURE MNCRL 4-0 27XMF (SUTURE) ×1 IMPLANT
SYS KII FIOS ACCESS ABD 5X100 (TROCAR) ×2
SYSTEM KII FIOS ACES ABD 5X100 (TROCAR) ×1 IMPLANT
TACKER 5MM HERNIA 3.5CML NAB (ENDOMECHANICALS) ×2 IMPLANT
TRAY FOLEY MTR SLVR 16FR STAT (SET/KITS/TRAYS/PACK) ×2 IMPLANT
TROCAR 130MM GELPORT  DAV (MISCELLANEOUS) ×2 IMPLANT
TROCAR BALLN GELPORT 12X130M (ENDOMECHANICALS) ×1 IMPLANT
WATER STERILE IRR 1000ML POUR (IV SOLUTION) ×2 IMPLANT

## 2019-07-30 NOTE — Discharge Instructions (Addendum)
AMBULATORY SURGERY  °DISCHARGE INSTRUCTIONS ° ° °1) The drugs that you were given will stay in your system until tomorrow so for the next 24 hours you should not: ° °A) Drive an automobile °B) Make any legal decisions °C) Drink any alcoholic beverage ° ° °2) You may resume regular meals tomorrow.  Today it is better to start with liquids and gradually work up to solid foods. ° °You may eat anything you prefer, but it is better to start with liquids, then soup and crackers, and gradually work up to solid foods. ° ° °3) Please notify your doctor immediately if you have any unusual bleeding, trouble breathing, redness and pain at the surgery site, drainage, fever, or pain not relieved by medication. ° ° ° °4) Additional Instructions: ° ° ° ° ° ° ° °Please contact your physician with any problems or Same Day Surgery at [phone], Monday through Friday 6 am to 4 pm, or [facility] [place] Main number at [phone].AMBULATORY SURGERY  °DISCHARGE INSTRUCTIONS ° ° °5) The drugs that you were given will stay in your system until tomorrow so for the next 24 hours you should not: ° °D) Drive an automobile °E) Make any legal decisions °F) Drink any alcoholic beverage ° ° °6) You may resume regular meals tomorrow.  Today it is better to start with liquids and gradually work up to solid foods. ° °You may eat anything you prefer, but it is better to start with liquids, then soup and crackers, and gradually work up to solid foods. ° ° °7) Please notify your doctor immediately if you have any unusual bleeding, trouble breathing, redness and pain at the surgery site, drainage, fever, or pain not relieved by medication. ° ° ° °8) Additional Instructions: ° ° ° ° ° ° ° °Please contact your physician with any problems or Same Day Surgery at [phone], Monday through Friday 6 am to 4 pm, or [facility] Main number at [phone]. °

## 2019-07-30 NOTE — Anesthesia Postprocedure Evaluation (Signed)
Anesthesia Post Note  Patient: Tony [name]  Procedure(s) Performed: LAPAROSCOPIC INGUINAL HERNIA (Right )  Patient location during evaluation: PACU Anesthesia Type: General Level of consciousness: awake and alert and oriented Pain management: pain level controlled Vital Signs Assessment: post-procedure vital signs reviewed and stable Respiratory status: spontaneous breathing Cardiovascular status: blood pressure returned to baseline Anesthetic complications: no     Last Vitals:  Vitals:   07/30/19 1700 07/30/19 1711  BP: 112/71 129/67  Pulse: 96 100  Resp:  16  Temp: (!) 36.1 C 36.4 C  SpO2: 100% 98%    Last Pain:  Vitals:   07/30/19 1711  TempSrc: Temporal  PainSc: 3                  [name]

## 2019-07-30 NOTE — Transfer of Care (Signed)
Immediate Anesthesia Transfer of Care Note  Patient: Tony [name]  Procedure(s) Performed: LAPAROSCOPIC INGUINAL HERNIA (Right )  Patient Location: PACU  Anesthesia Type:General  Level of Consciousness: drowsy  Airway & Oxygen Therapy: Patient Spontanous Breathing and Patient connected to face mask oxygen  Post-op Assessment: Report given to RN and Post -op Vital signs reviewed and stable  Post vital signs: Reviewed stable  Last Vitals:  Vitals Value Taken Time  BP 104/64 07/30/19 1546  Temp    Pulse 102 07/30/19 1546  Resp 15 07/30/19 1550  SpO2 100 % 07/30/19 1546  Vitals shown include unvalidated device data.  Last Pain:  Vitals:   07/30/19 1546  TempSrc:   PainSc: Asleep         Complications: No apparent anesthesia complications

## 2019-07-30 NOTE — Anesthesia Preprocedure Evaluation (Addendum)
Anesthesia Evaluation  Patient identified by MRN, date of birth, ID band Patient awake    Reviewed: Allergy & Precautions, NPO status , Patient's Chart, lab work & pertinent test results  History of Anesthesia Complications Negative for: history of anesthetic complications  Airway Mallampati: II   Neck ROM: Full    Dental  (+) Dental Advidsory Given, Caps, Teeth Intact, Chipped,    Pulmonary neg pulmonary ROS, former smoker,    Pulmonary exam normal        Cardiovascular Exercise Tolerance: Good negative cardio ROS Normal cardiovascular exam  ECG 06/29/18: normal   Neuro/Psych  Headaches, Seizures -, Well Controlled,  PSYCHIATRIC DISORDERS (PTSD) Depression    GI/Hepatic negative GI ROS, Neg liver ROS,   Endo/Other  negative endocrine ROS  Renal/GU negative Renal ROS     Musculoskeletal   Abdominal   Peds  Hematology negative hematology ROS (+)   Anesthesia Other Findings Past Medical History: No date: Anemia     Comment:  vitamin d and b12 deficiency No date: Colitis No date: Headache     Comment:  multiple/day. No date: Seizures (HCC)     Comment:  seizure like activity.  has not had any symptoms since               Topamax (march 2020) No date: Tubular adenoma of colon     Comment:  had polypectomy    Reproductive/Obstetrics                            Anesthesia Physical  Anesthesia Plan  ASA: II  Anesthesia Plan: General   Post-op Pain Management:    Induction: Intravenous  PONV Risk Score and Plan: 2 and Ondansetron, Dexamethasone, Midazolam and Treatment may vary due to age or medical condition  Airway Management Planned: Oral ETT  Additional Equipment:   Intra-op Plan:   Post-operative Plan: Extubation in OR  Informed Consent: I have reviewed the patients History and Physical, chart, labs and discussed the procedure including the risks, benefits and  alternatives for the proposed anesthesia with the patient or authorized representative who has indicated his/her understanding and acceptance.       Plan Discussed with: CRNA  Anesthesia Plan Comments:         Anesthesia Quick Evaluation

## 2019-07-30 NOTE — Op Note (Signed)
  Procedure Date:  07/30/2019  Pre-operative Diagnosis:  Right inguinal hernia  Post-operative Diagnosis:  Right inguinal hernia  Procedure:  Laparoscopic Right Inguinal Hernia Repair  Surgeon:  Melvyn Neth, MD  Assistant:  Gaynelle Arabian, PA-S  Anesthesia:  General endotracheal  Estimated Blood Loss:  15 ml  Specimens:  Right cord lipoma  Complications:  None  Findings: Patient had a very small opening at the right internal right, and had a moderate sized cord lipoma which was excised.  No hernia on the left.   Indications for Procedure:  This is a 29 y.o. male who presents with a right inguinal hernia with right groin pain and CT scan showing an inguinal hernia.  The options of surgery versus observation were reviewed with the patient and/or family. The risks of bleeding, abscess or infection, recurrence of symptoms, potential for an open procedure, injury to surrounding structures, and chronic pain were all discussed with the patient and was willing to proceed.  Description of Procedure: The patient was correctly identified in the preoperative area and brought into the operating room.  The patient was placed supine with VTE prophylaxis in place.  Appropriate time-outs were performed.  Anesthesia was induced and the patient was intubated.  Foley catheter was placed.  Appropriate antibiotics were infused.  The abdomen was prepped and draped in a sterile fashion. An infraumbilical incision was made. A cutdown technique was used to enter the abdominal cavity without injury, and a Hasson trocar was inserted.  Pneumoperitoneum was obtained with appropriate opening pressures.  Two 5-mm ports were placed in the right and left lateral positions under direct visualization.  Both inguinal regions were inspected for hernias and it was confirmed that the patient had a very small right inguinal hernia.  Using electocautery, the peritoneum on the right side was scored from the median  umbilical ligament laterally towards the ASIS.  The peritoneum was dissected off from the abdominal wall exposing the pubic bone and Cooper's ligament, spermatic cord and related structures.  The epigastric and iliac vessels were intact.  The hernia sac and contents were reduced preserving all structures.  A tear in the peritoneum was created during manipulation.  A moderate sized right cord lipoma was identified and excised and sent to pathology.  A medium Bard Ventralight 3D mesh was placed via the umbilical port and was tacked medially in place.  The peritoneum was then closed over using 2-0 V-loc suture, and the same was used to close the tear.  The 5 mm ports were removed under direct visualization and the Hasson trocar was removed.  The fascial opening was closed using 0 vicryl suture.  Local anesthetic was infused in all incisions and the incisions were closed with 4-0 Monocryl.  The wounds were cleaned and sealed with DermaBond.  Foley catheter was removed and the patient was emerged from anesthesia and extubated and brought to the recovery room for further management.  The patient tolerated the procedure well and all counts were correct at the end of the case.   Melvyn Neth, MD

## 2019-07-30 NOTE — Anesthesia Procedure Notes (Signed)
Procedure Name: Intubation Date/Time: 07/30/2019 12:27 PM Performed by: Justus Memory, CRNA Pre-anesthesia Checklist: Patient identified, Patient being monitored, Timeout performed, Emergency Drugs available and Suction available Patient Re-evaluated:Patient Re-evaluated prior to induction Oxygen Delivery Method: Circle system utilized Preoxygenation: Pre-oxygenation with 100% oxygen Induction Type: IV induction Ventilation: Mask ventilation without difficulty Laryngoscope Size: 3 and McGraph Grade View: Grade I Tube type: Oral Tube size: 7.0 mm Number of attempts: 1 Airway Equipment and Method: Stylet and Video-laryngoscopy Placement Confirmation: ETT inserted through vocal cords under direct vision,  positive ETCO2 and breath sounds checked- equal and bilateral Secured at: 21 cm Tube secured with: Tape Dental Injury: Teeth and Oropharynx as per pre-operative assessment

## 2019-07-30 NOTE — Interval H&P Note (Signed)
History and Physical Interval Note:  07/30/2019 11:52 AM  Tony [name]  has presented today for surgery, with the diagnosis of right inguinal hernia.  The various methods of treatment have been discussed with the patient and family. After consideration of risks, benefits and other options for treatment, the patient has consented to  Procedure(s): LAPAROSCOPIC INGUINAL HERNIA (Right) as a surgical intervention.  The patient's history has been reviewed, patient examined, no change in status, stable for surgery.  I have reviewed the patient's chart and labs.  Questions were answered to the patient's satisfaction.     Davione Lenker

## 2019-08-01 LAB — SURGICAL PATHOLOGY

## 2019-08-14 ENCOUNTER — Encounter: Payer: Self-pay | Admitting: Physician Assistant

## 2019-08-14 ENCOUNTER — Other Ambulatory Visit: Payer: Self-pay

## 2019-08-14 ENCOUNTER — Ambulatory Visit (INDEPENDENT_AMBULATORY_CARE_PROVIDER_SITE_OTHER): Payer: Self-pay | Admitting: Physician Assistant

## 2019-08-14 VITALS — BP 121/85 | HR 86 | Temp 97.2°F | Resp 14 | Ht 70.0 in | Wt 201.2 lb

## 2019-08-14 DIAGNOSIS — K4090 Unilateral inguinal hernia, without obstruction or gangrene, not specified as recurrent: Secondary | ICD-10-CM

## 2019-08-14 DIAGNOSIS — Z09 Encounter for follow-up examination after completed treatment for conditions other than malignant neoplasm: Secondary | ICD-10-CM

## 2019-08-14 NOTE — Patient Instructions (Addendum)
Otho Ket, PA-C advised patient to refrain from any heavy lifting of 15 pounds or more for two more weeks. Patient may resume with showers and if patient noticed the glue is coming off, he recommend patient he may remove residue of tape he may notice. Patient may walk.   GENERAL POST-OPERATIVE PATIENT INSTRUCTIONS   FOLLOW-UP:  Please make an appointment with your physician in.  Call your physician immediately if you have any fevers greater than 102.5, drainage from you wound that is not clear or looks infected, persistent bleeding, increasing abdominal pain, problems urinating, or persistent nausea/vomiting.    WOUND CARE INSTRUCTIONS:  Keep a dry clean dressing on the wound if there is drainage. The initial bandage may be removed after 24 hours.  Once the wound has quit draining you may leave it open to air.  If clothing rubs against the wound or causes irritation and the wound is not draining you may cover it with a dry dressing during the daytime.  Try to keep the wound dry and avoid ointments on the wound unless directed to do so.  If the wound becomes bright red and painful or starts to drain infected material that is not clear, please contact your physician immediately.  If the wound is mildly pink and has a thick firm ridge underneath it, this is normal, and is referred to as a healing ridge.  This will resolve over the next 4-6 weeks.  DIET:  You may eat any foods that you can tolerate.  It is a good idea to eat a high fiber diet and take in plenty of fluids to prevent constipation.  If you do become constipated you may want to take a mild laxative or take ducolax tablets on a daily basis until your bowel habits are regular.  Constipation can be very uncomfortable, along with straining, after recent surgery.  ACTIVITY:  You are encouraged to cough and deep breath or use your incentive spirometer if you were given one, every 15-30 minutes when awake.  This will help prevent respiratory  complications and low grade fevers post-operatively if you had a general anesthetic.  You may want to hug a pillow when coughing and sneezing to add additional support to the surgical area, if you had abdominal or chest surgery, which will decrease pain during these times.  You are encouraged to walk and engage in light activity for the next two weeks.  You should not lift more than 20 pounds during this time frame as it could put you at increased risk for complications.  Twenty pounds is roughly equivalent to a plastic bag of groceries.    MEDICATIONS:  Try to take narcotic medications and anti-inflammatory medications, such as tylenol, ibuprofen, naprosyn, etc., with food.  This will minimize stomach upset from the medication.  Should you develop nausea and vomiting from the pain medication, or develop a rash, please discontinue the medication and contact your physician.  You should not drive, make important decisions, or operate machinery when taking narcotic pain medication.  QUESTIONS:  Please feel free to call your physician or the hospital operator if you have any questions, and they will be glad to assist you.

## 2019-08-14 NOTE — Progress Notes (Signed)
[facility] POST-OP OFFICE VISIT  08/14/2019  HPI: Tony [name] is a 29 y.o. male 15 days s/p laparoscopic right inguinal hernia repair with Dr Hampton Abbot.   Overall doing well, intermittent right groin pain, [id number], worse with certain movements, controlled with ibuprofen. No bulge or swelling in the area. No issues with incisions. No fever, chills, nausea, or emesis. No other complaints.   Vital signs: BP 121/85   Pulse 86   Temp (!) 97.2 F (36.2 C) (Temporal)   Resp 14   Ht 5\' 10"  (1.778 m)   Wt 201 lb 3.2 oz (91.3 kg)   SpO2 99%   BMI 28.87 kg/m    Physical Exam: Constitutional: Well appearing male, NAD Abdomen: Soft, non-tender, non-distended, no rebound/guarding Skin: Laparoscopic incisions are CDI with dermabond, no erythema or drainage  Assessment/Plan: This is a 29 y.o. male  5 days s/p laparoscopic right inguinal hernia repair with Dr Hampton Abbot.   - Pain control prn; patient decline refills  - Okay to shower at home  - reviewed lifting restrictions; 2 more weeks  - reviewed pathology  - rtc prn  -- Edison Simon, PA-C [facility] 08/14/2019, 10:20 AM [phone] M-F: 7am - 4pm

## 2019-10-04 ENCOUNTER — Encounter: Payer: Self-pay | Admitting: Emergency Medicine

## 2019-10-04 ENCOUNTER — Ambulatory Visit
Admission: EM | Admit: 2019-10-04 | Discharge: 2019-10-04 | Disposition: A | Discharge status: Home / Self Care | Payer: Self-pay | Attending: Urgent Care | Admitting: Urgent Care

## 2019-10-04 ENCOUNTER — Other Ambulatory Visit: Payer: Self-pay

## 2019-10-04 DIAGNOSIS — R195 Other fecal abnormalities: Secondary | ICD-10-CM | POA: Insufficient documentation

## 2019-10-04 DIAGNOSIS — R197 Diarrhea, unspecified: Secondary | ICD-10-CM | POA: Insufficient documentation

## 2019-10-04 DIAGNOSIS — R1031 Right lower quadrant pain: Secondary | ICD-10-CM | POA: Insufficient documentation

## 2019-10-04 LAB — C DIFFICILE QUICK SCREEN W PCR REFLEX
C Diff antigen: NEGATIVE
C Diff interpretation: NOT DETECTED
C Diff toxin: NEGATIVE

## 2019-10-04 LAB — OCCULT BLOOD X 1 CARD TO LAB, STOOL: Fecal Occult Bld: NEGATIVE

## 2019-10-04 NOTE — Discharge Instructions (Signed)
It was very nice seeing you today in clinic. Thank you for entrusting me with your care.   Stool studies pending to rule out infectious etiology of your diarrhea. Will call you if there is anything that needs treatment. Recommend filling the Asacol as prescribed by GI once insurance is effective tomorrow. Call Dr. Allen Norris to discussed continued symptoms and concerns. Also recommend calling Dr. Hampton Abbot to discuss your pain if it continues.   If your symptoms/condition worsens, please seek follow up care either here or in the ER. Please remember, our [place] providers are "right here with you" when you need [place].   Again, it was my pleasure to take care of you today. Thank you for choosing our clinic. I hope that you start to feel better quickly.   Honor Loh, MSN, APRN, FNP-C, CEN Advanced Practice Provider [facility]

## 2019-10-04 NOTE — ED Triage Notes (Signed)
Patient in today c/o right groin pain and diarrhea x 2 weeks. Patient states he had hernia repair ~ 2 months ago and had cancerous polyps removed from his colon ~1 year ago. Patient has tried to get in with his GI doctor. Patient needs a note for work today.

## 2019-10-05 LAB — GASTROINTESTINAL PANEL BY PCR, STOOL (REPLACES STOOL CULTURE)

## 2019-10-05 NOTE — ED Provider Notes (Signed)
[place] [place]   Name: White Engelberg DOB: 03/04/1991 MRN: [id number] CSN: [id number] PCP: Glean Hess, MD  Arrival date and time:  10/04/19 1417  Chief Complaint:  Groin Pain and Diarrhea  NOTE: Prior to seeing the patient today, I have reviewed the triage nursing documentation and vital signs. Clinical staff has updated patient's PMH/PSHx, current medication list, and drug allergies/intolerances to ensure comprehensive history available to assist in medical decision making.   History:   HPI: Curtiss Trone is a 29 y.o. male who presents today with multiple complaints today as follows:  [place] Patient reporting a 2 weeks history of pain in his RIGHT groin. Patient underwent repair of a RIGHT inguinal hernia back on 07/30/2019; performed by Dr. Hampton Abbot. Notes reviewed revealing an uncomplicated surgical course. Since his surgery, patient has had soreness and intermittent aching in this area since his surgery. Surgical incisions have healed without any issues; no drainage, pain, or signs of infection. Patient reports the pain as been a constant "pressure" sensation that intensifies with activity. Patient has not contacted his surgeon about this concern. He recently started a new job at [employer], which he reports is a lot less physically demanding. Despite his symptoms, patient has not taken any over the counter interventions to help improve/relieve his reported symptoms at home.   DI[place] Patient reporting a 2 weeks history of diarrhea and abdominal cramping. He reports 4-5 stools daily that range from loose to liquid. Stools have been dark in color with no appreciated blood or mucous. No nausea, vomiting, fevers, or chills. Patient has recently made changes to his diet whereby he has increased his fiber intake. Patient has been seen by gastroenterology Allen Norris, MD) in the past for the same. He had a colonoscopy performed in 03/2019 that revealed ileitis and a single sessile polyp. Surgical  pathology results reviewed. There was no evidence of dysplasia. Repeat recommended in 5 years. Patient reporting that his symptoms over the last 2 weeks are "exactly like they were when he had the colon polyps". In review of Dr. Dorothey Baseman notes, it seems as if the working diagnosis was NSAID induced ileitis versus early IBS. Patient denies the use of NSAIDs and notes that since being told this was possible the cause of his symptoms, he has made a more conscious effort to limit the use of these medications. Patient was prescribed mesalamine 800 mg TID by GI, however he has not started this medication citing financial/insurance issues. Insurance with new job goes into effect on 10/05/2019. Patient has not been seen in follow up by GI since his colonoscopy.   Past Medical History:  Diagnosis Date  . Anemia    vitamin d and b12 deficiency  . Colitis   . Headache    multiple/day.  . Seizures ([place])    seizure like activity.  has not had any symptoms since Topamax (march 2020)  . Tubular adenoma of colon    had polypectomy     Past Surgical History:  Procedure Laterality Date  . COLONOSCOPY WITH PROPOFOL N/A 03/08/2019   Procedure: COLONOSCOPY WITH PROPOFOL;  Surgeon: Lucilla Lame, MD;  Location: [facility];  Service: Endoscopy;  Laterality: N/A;  . INGUINAL HERNIA REPAIR Right 07/30/2019   Procedure: LAPAROSCOPIC INGUINAL HERNIA;  Surgeon: Olean Ree, MD;  Location: [facility];  Service: General;  Laterality: Right;  . LIPOMA EXCISION  2019   lower right back  . POLYPECTOMY  03/08/2019   Procedure: POLYPECTOMY;  Surgeon: Lucilla Lame, MD;  Location:  [facility];  Service: Endoscopy;;  . TONSILLECTOMY    . VASECTOMY  04/25/2019    Family History  Problem Relation Age of Onset  . Crohn's disease Mother   . Healthy Father     Social History   Tobacco Use  . Smoking status: Former Smoker    Years: 2.00    Types: Cigarettes    Quit date: 02/2017    Years since quitting: 2.6   . Smokeless tobacco: Never Used  . Tobacco comment: 1 pack per month  Substance Use Topics  . Alcohol use: Yes    Alcohol/week: 3.0 standard drinks    Types: 3 Cans of beer per week    Comment:    . Drug use: Never    Patient Active Problem List   Diagnosis Date Noted  . Change in bowel function   . Seizure-like activity ([place]) 02/08/2019  . Inflammatory bowel disease 02/08/2019  . Back muscle spasm 02/08/2019  . Depression 02/08/2019    Home Medications:    Current Meds  Medication Sig  . acetaminophen (TYLENOL) 500 MG tablet Take 1,000 mg by mouth every 6 (six) hours as needed (for pain.).  Marland Kitchen Cholecalciferol (VITAMIN D3) 50 MCG (2000 UT) TABS Take 2,000 Units by mouth at bedtime.  Marland Kitchen ibuprofen (ADVIL) 600 MG tablet Take 1 tablet (600 mg total) by mouth every 8 (eight) hours as needed for mild pain or moderate pain.  Marland Kitchen topiramate (TOPAMAX) 25 MG tablet Take 50 mg by mouth at bedtime.     Allergies:   Patient has no known allergies.  Review of Systems (ROS):  Review of systems NEGATIVE unless otherwise noted in narrative H&P section.   Vital Signs: Today's Vitals   10/04/19 1449 10/04/19 1450 10/04/19 1451 10/04/19 1537  BP: 104/72     Pulse: 62     Resp: 18     Temp: 98.2 F (36.8 C)     TempSrc: Oral     SpO2: 100%     Weight:   210 lb (95.3 kg)   Height:   5\' 10"  (1.778 m)   PainSc:  4   4     Physical Exam: Physical Exam  Constitutional: He is oriented to person, place, and time and well-developed, well-nourished, and in no distress.  HENT:  Head: Normocephalic and atraumatic.  Eyes: Pupils are equal, round, and reactive to light.  Cardiovascular: Normal rate, regular rhythm, normal heart sounds and intact distal pulses.  Pulmonary/Chest: Effort normal and breath sounds normal.  Abdominal: Soft. Normal appearance and bowel sounds are normal. There is generalized abdominal tenderness (diffuse "aching").    No palpable hernia noted to RIGHT inguinal  area. No erythema or significant edema.   Neurological: He is alert and oriented to person, place, and time. Gait normal.  Skin: Skin is warm and dry. No rash noted. He is not diaphoretic.  Psychiatric: Memory, affect and judgment normal. His mood appears anxious.  Nursing note and vitals reviewed.   Urgent Care Treatments / Results:   Orders Placed This Encounter  Procedures  . C Difficile Quick Screen w PCR reflex  . Gastrointestinal Panel by PCR , Stool  . Occult blood card to lab, stool    LABS: PLEASE NOTE: all labs that were ordered this encounter are listed, however only abnormal results are displayed. Labs Reviewed  C DIFFICILE QUICK SCREEN W PCR REFLEX  GASTROINTESTINAL PANEL BY PCR, STOOL (REPLACES STOOL CULTURE)  OCCULT BLOOD X 1 CARD TO LAB,  STOOL    EKG: -None  RADIOLOGY: No results found.  PROCEDURES: Procedures  MEDICATIONS RECEIVED THIS VISIT: Medications - No data to display  PERTINENT CLINICAL COURSE NOTES/UPDATES:   Initial Impression / Assessment and Plan / Urgent Care Course:  Pertinent labs & imaging results that were available during my care of the patient were personally reviewed by me and considered in my medical decision making (see lab/imaging section of note for values and interpretations).  Jock Meeler is a 29 y.o. male who presents to [facility] today with complaints of Groin Pain and Diarrhea  Patient is well appearing overall in clinic today. He does not appear to be in any acute distress. Presenting symptoms (see HPI) and exam as documented above. Patient with multiple concerns. Will address each as follows:   GROIN PAIN - patient states, "it is still sore. I thought it would be better by now". Patient recently changed jobs to something that is less physically demanding. Not taking anything for his symptoms. Discussed obtaining US imaging to reassess area following surgical repair. Patient declined citing that he was "not  really that worried about it". Patient feels like the pain should have resolved. Pre-operative notes indicate possible inguinal nerve irritation prior to repair. Perhaps this is still the case. Recommended to contact Dr. Hampton Abbot for a follow up evaluation.    DIARRHEA - chronic issue it seems. DDx has been NSAID induced colitis vs. IBS. Followed by GI Allen Norris, MD). Has not started ordered mesalamine. Guaiac x 1 (-) today. Will send stool studies to rule out infectious etiology (Clostridioides difficile and GI panel/culture). Will call patient if results (+) and require Tx. Insurance goes into [employer]. Recommend starting mesalamine as prescribed. Supportive care discussed. Encouraged to contact Dr. Allen Norris for a follow up appointment to discuss further evaluation +/- repeat colonoscopy.   Patient request work note due to presenting symptoms. Given pain and diarrhea, this is reasonable. He was provided with the appropriate documentation to provide to his place of employment that will allow for him to RTW on 10/06/2019 with no restrictions.   I have reviewed the follow up and strict return precautions for any new or worsening symptoms. Patient is aware of symptoms that would be deemed urgent/emergent, and would thus require further evaluation either here or in the emergency department. At the time of discharge, he verbalized understanding and consent with the discharge plan as it was reviewed with him. All questions were fielded by provider and/or clinic staff prior to patient discharge.    Final Clinical Impressions / Urgent Care Diagnoses:   Final diagnoses:  Diarrhea, unspecified type  Dark stools  Groin pain, right    New Prescriptions:  [place] Controlled Substance Registry consulted? Not Applicable  No orders of the defined types were placed in this encounter.   Recommended Follow up Care:  Patient encouraged to follow up with the following provider within the specified time frame, or sooner  as dictated by the severity of his symptoms. As always, he was instructed that for any urgent/emergent care needs, he should seek care either here or in the emergency department for more immediate evaluation.  Follow-up Information    Call  Olean Ree, MD.   Specialty: General Surgery Why: Need to schedule re-evaluation of your pain if not improving. Contact information: [address] [place] [address] [phone]        Call  Lucilla Lame, MD.   Specialty: Gastroenterology Why: Need to schedule appointment for  re-evaluation. Contact information: [place]  [place] [address] [id number]         NOTE: This note was prepared using Dragon dictation software along with smaller phrase technology. Despite my best ability to proofread, there is the potential that transcriptional errors may still occur from this process, and are completely unintentional.    Karen Kitchens, NP 10/05/19 1516

## 2019-10-11 ENCOUNTER — Encounter: Payer: Self-pay | Admitting: Surgery

## 2019-10-11 ENCOUNTER — Other Ambulatory Visit: Payer: Self-pay

## 2019-10-11 ENCOUNTER — Ambulatory Visit (INDEPENDENT_AMBULATORY_CARE_PROVIDER_SITE_OTHER): Payer: Self-pay | Admitting: Surgery

## 2019-10-11 VITALS — BP 118/82 | HR 65 | Temp 97.6°F | Resp 12 | Wt 203.0 lb

## 2019-10-11 DIAGNOSIS — Z09 Encounter for follow-up examination after completed treatment for conditions other than malignant neoplasm: Secondary | ICD-10-CM

## 2019-10-11 DIAGNOSIS — K4090 Unilateral inguinal hernia, without obstruction or gangrene, not specified as recurrent: Secondary | ICD-10-CM

## 2019-10-11 DIAGNOSIS — G8929 Other chronic pain: Secondary | ICD-10-CM

## 2019-10-11 DIAGNOSIS — R1031 Right lower quadrant pain: Secondary | ICD-10-CM

## 2019-10-11 MED ORDER — TRIAMCINOLONE ACETONIDE 40 MG/ML IJ SUSP
40.0000 mg | Freq: Once | INTRAMUSCULAR | Status: AC
  Administered 2019-10-11: 40 mg via INTRAMUSCULAR

## 2019-10-11 NOTE — Patient Instructions (Addendum)
Dr Hampton Abbot did an Ileo-Inguinal nerve block. The medication that we used may last hours to several days. You do not need to keep the bandage over the area. Shower as normally.   We will see you in 2 weeks. See your appointment below.   Peripheral Nerve Block  Peripheral nerve block is an injection of numbing medicine (regional anesthetic) near a nerve. The regional anesthetic numbs everything below the injection site. This provides pain relief during and after a medical procedure. Generally, you will be awake while a peripheral nerve block is performed. You also may receive medicines to help you feel relaxed and comfortable during the procedure (sedatives). When you have a peripheral nerve block, you are not exposed to the risks associated with medicine that makes you fall asleep (general anesthetic). You may also:  Need less pain medicine after your procedure.  Have a lower risk of blood clots.  Recover sooner. Tell a health care provider about:  Any allergies you have.  All medicines you are taking, including vitamins, herbs, eye drops, creams, and over-the-counter medicines.  Any problems you or family members have had with anesthetic medicines.  Any blood disorders you have.  Any surgeries you have.  Any medical conditions you have.  Whether you are pregnant or may be pregnant. What are the risks? Generally, this is a safe procedure. However, problems may occur, including:  Infection.  Bleeding.  Allergic reactions to medicines.  Damage to other structures or organs, such as temporary or permanent nerve damage. What happens before the procedure? Staying hydrated Follow instructions from your health care provider about hydration, which may include:  Up to 2 hours before the procedure - you may continue to drink clear liquids, such as water, clear fruit juice, black coffee, and plain tea. Eating and drinking restrictions Follow instructions from your health care  provider about eating and drinking, which may include:  8 hours before the procedure - stop eating heavy meals or foods such as meat, fried foods, or fatty foods.  6 hours before the procedure - stop eating light meals or foods, such as toast or cereal.  6 hours before the procedure - stop drinking milk or drinks that contain milk.  2 hours before the procedure - stop drinking clear liquids. General instructions  Ask your health care provider about: ? Changing or stopping your regular medicines. This is especially important if you are taking diabetes medicines or blood thinners. ? Taking medicines such as aspirin and ibuprofen. These medicines can thin your blood. Do not take these medicines unless your health care provider tells you to take them. ? Taking over-the-counter medicines, vitamins, herbs, and supplements.  Plan to have someone take you home from the hospital or clinic.  Plan to have a responsible adult care for you for at least 24 hours after you leave the hospital or clinic. This is important. What happens during the procedure?   An IV will be inserted into one of your veins.  You may be given a sedative.  Your nerve may be located by using: ? Sound waves that create images of the area (ultrasound). ? A device that activates the nerve and causes your muscles to twitch (nerve stimulator).  The skin around your injection site will be cleaned with a germ-killing solution.  Medicine to numb your injection site (local anesthetic) may be injected into the tissue above your nerve.  Regional anesthetic will be injected into the area near your nerve. ? The medicine will be  injected around the nerve, not into it. ? You should not feel any pain. The area of your peripheral nerve block will begin to feel warm and numb.  A thin, flexible tube (catheter) may be inserted near your nerve. The catheter may remain there to continue delivering the regional anesthetic during and after  your medical procedure.  When the area of your peripheral nerve block is completely numb, the medical procedure can be performed.  Your injection site may be covered with a bandage (dressing) when your medical procedure is done. The procedure may vary among health care providers and hospitals. What can I expect after the procedure?  If you do not have a catheter, you may continue to be numb for up to 36 hours. The length of this time depends on how much anesthetic was injected.  If you have a catheter, you will continue to be numb until the catheter is removed.  To reduce your risk of injury: ? Do not expose the numb area to heat or cold. ? Do not stand up or try to walk without help if you have a nerve block in one or both legs. Get help and limit your activity as told by your health care provider.  As the medicine wears off, you will have a gradual return of feeling in the area that is supplied by the nerve.  Your blood pressure, heart rate, breathing rate, and blood oxygen level will be monitored until the medicines you were given have worn off. Follow these instructions at home: Activity  Do not drive or use heavy machinery until your health care provider approves.  Do not lift anything that is heavier than 10 lb (4.5 kg), or the limit that you are told, until your health care provider says that it is safe. Injection site care  If you have a dressing, remove it 24 hours after your procedure, or as told by your health care provider.  Check your injection site every day for signs of infection. Check for: ? Redness, swelling, or pain. ? Warmth. ? Fluid or blood. ? Pus or a bad smell. General instructions  Take over-the-counter and prescription medicines only as told by your health care provider.  Do not take showers or baths, swim, or use a hot tub until your health care provider approves.  Keep all follow-up visits as told by your health care provider. This is  important. Contact a health care provider if:  You continue to have numbness, weakness, or tingling after your medicine has worn off.  You have a fever.  You have redness, swelling, or pain around your injection site. Get help right away if:  You have trouble breathing. Summary  Peripheral nerve block is an injection of numbing medicine (regional anesthetic) near a nerve. This provides pain relief during and after a medical procedure.  Feeling will gradually return to the area that is supplied by the nerve.  A catheter may be inserted to continue to provide medicine for up to several days. This information is not intended to replace advice given to you by your health care provider. Make sure you discuss any questions you have with your health care provider. Document Revised: 07/09/2018 Document Reviewed: 03/15/2017 Elsevier Patient Education  2020 [employer].

## 2019-10-11 NOTE — Progress Notes (Signed)
10/11/2019  HPI: Alisher Belton is a 29 y.o. male s/p laparoscopic right inguinal hernia repair with mesh on 07/30/19.  He presents for follow up because of right groin pain or discomfort that he's been having.  He reports that at first he just thought it was soreness from the surgery, but it has not been improving despite of being 2.5 months out from surgery.  He also has noticed that recently the discomfort as become somewhat more intense.  Sometimes it radiates down the upper thigh, sometimes it radiates across to the left lower quadrant.  Reports some nausea but thinks it's more GI related and he's seen GI next month on follow up.  Vital signs: BP 118/82   Pulse 65   Temp 97.6 F (36.4 C)   Resp 12   Wt 203 lb (92.1 kg)   SpO2 97%   BMI 29.13 kg/m    Physical Exam: Constitutional:  No acute distress Abdomen:  Soft, non-distended, with some discomfort or mild tenderness in the right groin.  The incisions are well healed.  The pain starts around the ASIS and seems to follow the pathway of the ilioinguinal nerve along the groin.  No scrotal or testicular issues.  There is no evidence of hernia recurrence on exam.  Assessment/Plan: This is a 29 y.o. male s/p laparoscopic right inguinal hernia repair.  --Discussed with the patient that this could be nerve irritation from the scar tissue as things have healed.  Recommended that we do a nerve block today to see if the pain improves.  Based on this, we can discuss pain management going forwards.  Otherwise, if there's no improvement, may be worthwhile to do an imaging study to verify that the hernia repair is intact. --Discussed with the patient the risks of bleeding, infection, injury to surrounding structures, and he's willing to proceed.  The skin around the right ASIS was prepped with chlorhexidine, and 20 ml of mix of triamcinolone, marcaine, and lidocaine was infused as an ilioinguinal block.  There was no apparent complications.  BandAid was  placed at the injection site. --Patient will follow up in two weeks so we can discuss his progress.   Melvyn Neth, [facility]

## 2019-10-14 ENCOUNTER — Other Ambulatory Visit: Payer: Self-pay

## 2019-10-14 ENCOUNTER — Encounter: Payer: Self-pay | Admitting: *Deleted

## 2019-10-14 DIAGNOSIS — Z87891 Personal history of nicotine dependence: Secondary | ICD-10-CM | POA: Insufficient documentation

## 2019-10-14 DIAGNOSIS — R109 Unspecified abdominal pain: Secondary | ICD-10-CM | POA: Insufficient documentation

## 2019-10-14 DIAGNOSIS — Z79899 Other long term (current) drug therapy: Secondary | ICD-10-CM | POA: Insufficient documentation

## 2019-10-14 LAB — BASIC METABOLIC PANEL
Anion gap: 9 (ref 5–15)
BUN: 14 mg/dL (ref 6–20)
CO2: 24 mmol/L (ref 22–32)
Calcium: 9.2 mg/dL (ref 8.9–10.3)
Chloride: 104 mmol/L (ref 98–111)
Creatinine, Ser: 1.17 mg/dL (ref 0.61–1.24)
GFR calc Af Amer: >60 mL/min (ref >60)
GFR calc non Af Amer: >60 mL/min (ref >60)
Glucose, Bld: 102 mg/dL — ABNORMAL HIGH (ref 70–99)
Potassium: 3.9 mmol/L (ref 3.5–5.1)
Sodium: 137 mmol/L (ref 135–145)

## 2019-10-14 LAB — CBC
HCT: 45.9 % (ref 39.0–52.0)
Hemoglobin: 15.7 g/dL (ref 13.0–17.0)
MCH: 30.2 pg (ref 26.0–34.0)
MCHC: 34.2 g/dL (ref 30.0–36.0)
MCV: 88.3 fL (ref 80.0–100.0)
Platelets: 299 10*3/uL (ref 150–400)
RBC: 5.2 MIL/uL (ref 4.22–5.81)
RDW: 12.8 % (ref 11.5–15.5)
WBC: 9.3 10*3/uL (ref 4.0–10.5)
nRBC: 0 % (ref 0.0–0.2)

## 2019-10-14 LAB — TROPONIN I (HIGH SENSITIVITY): Troponin I (High Sensitivity): <2 ng/L (ref <18)

## 2019-10-14 NOTE — ED Triage Notes (Signed)
Pt has surgery right groin hernia 2 months ago.  Today , his son ran into him and now pt has increased right groin pain.  Pt alert  Speech clear.

## 2019-10-15 ENCOUNTER — Emergency Department: Payer: PRIVATE HEALTH INSURANCE

## 2019-10-15 ENCOUNTER — Emergency Department
Admission: EM | Admit: 2019-10-15 | Discharge: 2019-10-15 | Disposition: A | Discharge status: Home / Self Care | Payer: PRIVATE HEALTH INSURANCE | Attending: Emergency Medicine | Admitting: Emergency Medicine

## 2019-10-15 ENCOUNTER — Encounter: Payer: Self-pay | Admitting: Radiology

## 2019-10-15 DIAGNOSIS — R1031 Right lower quadrant pain: Secondary | ICD-10-CM

## 2019-10-15 LAB — URINALYSIS, COMPLETE (UACMP) WITH MICROSCOPIC
Bacteria, UA: NONE SEEN
Bilirubin Urine: NEGATIVE
Glucose, UA: NEGATIVE mg/dL
Hgb urine dipstick: NEGATIVE
Ketones, ur: NEGATIVE mg/dL
Leukocytes,Ua: NEGATIVE
Nitrite: NEGATIVE
Protein, ur: NEGATIVE mg/dL
Specific Gravity, Urine: >1.046 — ABNORMAL HIGH (ref 1.005–1.030)
Squamous Epithelial / HPF: NONE SEEN (ref 0–5)
WBC, UA: NONE SEEN WBC/hpf (ref 0–5)
pH: 5 (ref 5.0–8.0)

## 2019-10-15 MED ORDER — ONDANSETRON HCL 4 MG/2ML IJ SOLN
4.0000 mg | Freq: Once | INTRAMUSCULAR | Status: AC
  Administered 2019-10-15: 4 mg via INTRAVENOUS
  Filled 2019-10-15: qty 2

## 2019-10-15 MED ORDER — KETOROLAC TROMETHAMINE 30 MG/ML IJ SOLN
15.0000 mg | Freq: Once | INTRAMUSCULAR | Status: AC
  Administered 2019-10-15: 15 mg via INTRAVENOUS
  Filled 2019-10-15: qty 1

## 2019-10-15 MED ORDER — IOHEXOL 300 MG/ML  SOLN
100.0000 mL | Freq: Once | INTRAMUSCULAR | Status: AC | PRN
  Administered 2019-10-15: 100 mL via INTRAVENOUS

## 2019-10-15 NOTE — ED Provider Notes (Signed)
[facility] Emergency Department Provider Note  ____________________________________________  Time seen: Approximately 3:42 AM  I have reviewed the triage vital signs and the nursing notes.   HISTORY  Chief Complaint Groin Pain   HPI Tony [name] is a 29 y.o. male with history of R inguinal hernia s/p repair in 07/2019, seizures, colitis who presents for evaluation of R scrotum pain. Patient reports that he has been having pain in his R groin since the surgery however the pain has been worse over the last couple of weeks.  Yesterday evening he was laying in bed and his son jumped over him and hit his head onto the patient's scrotum.  Since then the pain has been much more severe.  The pain is sharp, 7 out of 10, radiates down the right thigh and to the right lower quadrant.  He has had some nausea but no vomiting.  He has diarrhea daily however that is chronic for him.  No fever or chills, no penile discharge, no dysuria.  Past Medical History:  Diagnosis Date  . Anemia    vitamin d and b12 deficiency  . Colitis   . Headache    multiple/day.  . Seizures ([place])    seizure like activity.  has not had any symptoms since Topamax (march 2020)  . Tubular adenoma of colon    had polypectomy     Patient Active Problem List   Diagnosis Date Noted  . Change in bowel function   . Seizure-like activity ([place]) 02/08/2019  . Inflammatory bowel disease 02/08/2019  . Back muscle spasm 02/08/2019  . Depression 02/08/2019    Past Surgical History:  Procedure Laterality Date  . COLONOSCOPY WITH PROPOFOL N/A 03/08/2019   Procedure: COLONOSCOPY WITH PROPOFOL;  Surgeon: Lucilla Lame, MD;  Location: [place];  Service: Endoscopy;  Laterality: N/A;  . INGUINAL HERNIA REPAIR Right 07/30/2019   Procedure: LAPAROSCOPIC INGUINAL HERNIA;  Surgeon: Olean Ree, MD;  Location: [facility];  Service: General;  Laterality: Right;  . LIPOMA EXCISION  2019   lower right  back  . POLYPECTOMY  03/08/2019   Procedure: POLYPECTOMY;  Surgeon: Lucilla Lame, MD;  Location: [place];  Service: Endoscopy;;  . TONSILLECTOMY    . VASECTOMY  04/25/2019    Prior to Admission medications   Medication Sig Start Date End Date Taking? Authorizing Provider  acetaminophen (TYLENOL) 500 MG tablet Take 1,000 mg by mouth every 6 (six) hours as needed (for pain.).    [provider]  Cholecalciferol (VITAMIN D3) 50 MCG (2000 UT) TABS Take 2,000 Units by mouth at bedtime.    [provider]  cyanocobalamin (,VITAMIN B-12,) 1000 MCG/ML injection inject 69mL IM once a week for four weeks then once a month for four months. 03/26/19   [provider]  ibuprofen (ADVIL) 600 MG tablet Take 1 tablet (600 mg total) by mouth every 8 (eight) hours as needed for mild pain or moderate pain. 07/30/19   Olean Ree, MD  topiramate (TOPAMAX) 25 MG tablet Take 50 mg by mouth at bedtime.  03/21/19   [provider]  Mesalamine (ASACOL) 400 MG CPDR DR capsule Take 2 capsules (800 mg total) by mouth 3 (three) times daily. 03/14/19 07/08/19  Lucilla Lame, MD    Allergies Patient has no known allergies.  Family History  Problem Relation Age of Onset  . Crohn's disease Mother   . Healthy Father     Social History Social History   Tobacco  Use  . Smoking status: Former Smoker    Years: 2.00    Types: Cigarettes    Quit date: 02/2017    Years since quitting: 2.6  . Smokeless tobacco: Never Used  . Tobacco comment: 1 pack per month  Substance Use Topics  . Alcohol use: Yes    Alcohol/week: 3.0 standard drinks    Types: 3 Cans of beer per week    Comment:    . Drug use: Never    Review of Systems  Constitutional: Negative for fever. Eyes: Negative for visual changes. ENT: Negative for sore throat. Neck: No neck pain  Cardiovascular: Negative for chest pain. Respiratory: Negative for shortness of breath. Gastrointestinal: Negative for  abdominal pain, vomiting or diarrhea. Genitourinary: Negative for dysuria. + R groin pain Musculoskeletal: Negative for back pain. Skin: Negative for rash. Neurological: Negative for headaches, weakness or numbness. Psych: No SI or HI  ____________________________________________   PHYSICAL EXAM:  VITAL SIGNS: ED Triage Vitals [10/14/19 2035]  Enc Vitals Group     BP 128/80     Pulse Rate 76     Resp 20     Temp 98.4 F (36.9 C)     Temp Source Oral     SpO2 99 %     Weight 203 lb (92.1 kg)     Height 5\' 10"  (1.778 m)     Head Circumference      Peak Flow      Pain Score 7     Pain Loc      Pain Edu?      Excl. in [place]?     Constitutional: Alert and oriented. Well appearing and in no apparent distress. HEENT:      Head: Normocephalic and atraumatic.         Eyes: Conjunctivae are normal. Sclera is non-icteric.       Mouth/Throat: Mucous membranes are moist.       Neck: Supple with no signs of meningismus. Cardiovascular: Regular rate and rhythm. No murmurs, gallops, or rubs. Respiratory: Normal respiratory effort. Lungs are clear to auscultation bilaterally.  Gastrointestinal: Soft, tender to palpation the right lower quadrant, and non distended with positive bowel sounds. No rebound or guarding. GU: Bilateral testicles are descended with no tenderness to palpation, bilateral positive cremasteric reflexes are present, no swelling or erythema of the scrotum. No evidence of inguinal hernia. Musculoskeletal:  No edema, cyanosis, or erythema of extremities. Neurologic: Normal speech and language. Face is symmetric. Moving all extremities. No gross focal neurologic deficits are appreciated. Skin: Skin is warm, dry and intact. No rash noted. Psychiatric: Mood and affect are normal. Speech and behavior are normal.  ____________________________________________   LABS (all labs ordered are listed, but only abnormal results are displayed)  Labs Reviewed  BASIC METABOLIC PANEL  - Abnormal; Notable for the following components:      Result Value   Glucose, Bld 102 (*)    All other components within normal limits  URINALYSIS, COMPLETE (UACMP) WITH MICROSCOPIC - Abnormal; Notable for the following components:   Color, Urine YELLOW (*)    APPearance CLEAR (*)    Specific Gravity, Urine >1.046 (*)    All other components within normal limits  CBC  TROPONIN I (HIGH SENSITIVITY)   ____________________________________________  EKG  none  ____________________________________________  RADIOLOGY  I have personally reviewed the images performed during this visit and I agree with the Radiologist's read.   Interpretation by Radiologist:  [place]  Result Date: 10/15/2019 CLINICAL DATA:  Posttraumatic right groin pain. Hernia surgery 2 months ago. EXAM: CT ABDOMEN AND PELVIS WITH CONTRAST TECHNIQUE: Multidetector CT imaging of the abdomen and pelvis was performed using the standard protocol following bolus administration of intravenous contrast. CONTRAST:  165mL OMNIPAQUE IOHEXOL 300 MG/ML  SOLN COMPARISON:  07/19/2019 is FINDINGS: Lower chest:  No contributory findings. Hepatobiliary: 6 mm low-density nodule in segment 2 of the liver. 1 cm nodule in the superficial right lobe liver. 5 mm nodule is seen just right of the gallbladder. No change from prior, but very short follow-up. Follow-up rationale was provided previously.No evidence of biliary obstruction or stone. Pancreas: Unremarkable. Spleen: Unremarkable. Adrenals/Urinary Tract: Negative adrenals. No hydronephrosis or stone. Unremarkable bladder. Stomach/Bowel:  No obstruction. No appendicitis. Vascular/Lymphatic: No acute vascular abnormality. No mass or adenopathy. Reproductive:No pathologic findings. Other: No ascites or pneumoperitoneum. No swelling or recurrent hernia at the right groin. Musculoskeletal: No acute abnormalities. IMPRESSION: 1. No acute finding or evidence of injury. 2.  Subcentimeter liver lesions are unchanged from prior. Electronically Signed   By: Monte Fantasia M.D.   On: 10/15/2019 04:28      ____________________________________________   PROCEDURES  Procedure(s) performed: None Procedures Critical Care performed:  None ____________________________________________   INITIAL IMPRESSION / ASSESSMENT AND PLAN / ED COURSE   29 y.o. male with history of R inguinal hernia s/p repair in 07/2019, seizures, colitis who presents for evaluation of R scrotum pain which has been ongoing since his surgery 2.5 months ago but worse over the last week and much worse since last night after trauma.  On exam he is well-appearing in no distress with normal vital signs, his abdomen is soft with tenderness to palpation of the right lower quadrant, no rebound or guarding.  GU exam is normal.  Differential diagnoses including recurrent hernia versus torsion versus bruising/hematoma versus appendicitis versus kidney stone versus UTI.  Labs showing no leukocytosis, normal kidney function, normal electrolytes, no anemia.  Will give IV Toradol and Zofran for symptom relief.  We will get a CT abdomen pelvis.  Old medical records reviewed.  _________________________ 5:38 AM on 10/15/2019 -----------------------------------------  UA negative for UTI.  CT visualized and read by me as negative, confirmed by radiology.  Discussed incidental findings of CT with patient.  Pain improved after IV Toradol.  Will discharge home with supportive care follow-up with Dr. Hampton Abbot.  Discussed my standard return precautions.     _____________________________________________ Please note:  Patient was evaluated in Emergency Department today for the symptoms described in the history of present illness. Patient was evaluated in the context of the global COVID-19 pandemic, which necessitated consideration that the patient might be at risk for infection with the SARS-CoV-2 virus that causes  COVID-19. Institutional protocols and algorithms that pertain to the evaluation of patients at risk for COVID-19 are in a state of rapid change based on information released by regulatory bodies including the CDC and federal and state organizations. These policies and algorithms were followed during the patient's care in the ED.  Some ED evaluations and interventions may be delayed as a result of limited staffing during the pandemic.   [place] Controlled Substance Database was reviewed by me. ____________________________________________   FINAL CLINICAL IMPRESSION(S) / ED DIAGNOSES   Final diagnoses:  Right groin pain      NEW MEDICATIONS STARTED DURING THIS VISIT:  ED Discharge Orders    None       Note:  This document was prepared using Dragon  voice recognition software and may include unintentional dictation errors.    Alfred Levins, [place], MD 10/15/19 [id number]

## 2019-10-15 NOTE — Discharge Instructions (Addendum)
Follow up with Dr. Hampton Abbot.  Return to the emergency room for new or worsening abdominal pain or scrotum pain.

## 2019-10-25 ENCOUNTER — Ambulatory Visit (INDEPENDENT_AMBULATORY_CARE_PROVIDER_SITE_OTHER): Payer: Self-pay | Admitting: Surgery

## 2019-10-25 ENCOUNTER — Other Ambulatory Visit: Payer: Self-pay

## 2019-10-25 ENCOUNTER — Encounter: Payer: Self-pay | Admitting: Surgery

## 2019-10-25 VITALS — BP 124/75 | HR 72 | Temp 94.6°F | Ht 70.0 in | Wt 201.0 lb

## 2019-10-25 DIAGNOSIS — G8929 Other chronic pain: Secondary | ICD-10-CM

## 2019-10-25 DIAGNOSIS — R1031 Right lower quadrant pain: Secondary | ICD-10-CM

## 2019-10-25 NOTE — Patient Instructions (Signed)
   Follow-up with our office as needed.  Please call and ask to speak with a nurse if you develop questions or concerns.  

## 2019-10-25 NOTE — Progress Notes (Signed)
10/25/2019  HPI: Tony [name] is a 29 y.o. male s/p laparoscopic right inguinal hernia repair on 07/30/2019.  He was seen on 5/7 with chronic right groin discomfort.  We injected him with a combination of Kenalog and bupivacaine with the patient reports that it worked well for about a day and afterwards the discomfort starting creeping up again.  He went to emergency room on 5/11 because his son by accident kicked him in the upper thigh and this caused a lot of pain.  He had a CAT scan in the emergency room which was negative for any hernia recurrence, fluid collections, masses, or other complications.  Presents today for follow-up.  Patient reports that he still having some discomfort in the right groin area which is towards the distal portion of the groin.  He also reports that when he has ejaculation, there is discomfort in the same area of the groin.  He also has also noted that he is urinating more frequently but denies any pain with urination.  Vital signs: BP 124/75   Pulse 72   Temp (!) 94.6 F (34.8 C) (Temporal)   Ht 5\' 10"  (1.778 m)   Wt 201 lb (91.2 kg)   SpO2 97%   BMI 28.84 kg/m    Physical Exam: Constitutional: No acute distress Abdomen:  deferred  Assessment/Plan: This is a 29 y.o. male s/p laparoscopic right inguinal hernia repair, now with low-grade chronic right groin discomfort.  -Patient reports that he is taking Tylenol as needed for pain and that with that he is able to go about his usual days without complications.  The right now he is only having very mild degree of chronic right groin pain or neuralgia after laparoscopic right inguinal hernia repair.  Given that his pain is very manageable and mild in nature, counting at this point we need to escalate to any other medications.  Discussed with him that if the Tylenol does not work as effectively in the future, we could start him on gabapentin for neuropathic type of pain.  He understands this plan.  I think at this  point we do not need to pursue any surgical intervention for this and the patient agrees.  He will follow-up with [place] as needed.   Melvyn Neth, [facility]

## 2019-11-06 ENCOUNTER — Ambulatory Visit: Payer: Self-pay | Admitting: Gastroenterology

## 2020-03-18 ENCOUNTER — Emergency Department: Payer: Self-pay

## 2020-03-18 ENCOUNTER — Observation Stay: Payer: Self-pay

## 2020-03-18 ENCOUNTER — Other Ambulatory Visit: Payer: Self-pay

## 2020-03-18 ENCOUNTER — Observation Stay
Admission: EM | Admit: 2020-03-18 | Discharge: 2020-03-19 | Disposition: A | Discharge status: Home / Self Care | Payer: 59 | Attending: Family Medicine | Admitting: Family Medicine

## 2020-03-18 DIAGNOSIS — G8194 Hemiplegia, unspecified affecting left nondominant side: Secondary | ICD-10-CM

## 2020-03-18 DIAGNOSIS — R202 Paresthesia of skin: Secondary | ICD-10-CM

## 2020-03-18 DIAGNOSIS — Z87891 Personal history of nicotine dependence: Secondary | ICD-10-CM | POA: Insufficient documentation

## 2020-03-18 DIAGNOSIS — R531 Weakness: Principal | ICD-10-CM

## 2020-03-18 DIAGNOSIS — R569 Unspecified convulsions: Secondary | ICD-10-CM

## 2020-03-18 DIAGNOSIS — Z8669 Personal history of other diseases of the nervous system and sense organs: Secondary | ICD-10-CM | POA: Insufficient documentation

## 2020-03-18 DIAGNOSIS — Z20822 Contact with and (suspected) exposure to covid-19: Secondary | ICD-10-CM | POA: Insufficient documentation

## 2020-03-18 DIAGNOSIS — R42 Dizziness and giddiness: Secondary | ICD-10-CM | POA: Insufficient documentation

## 2020-03-18 LAB — CBC
HCT: 46.2 % (ref 39.0–52.0)
Hemoglobin: 15.8 g/dL (ref 13.0–17.0)
MCH: 29.5 pg (ref 26.0–34.0)
MCHC: 34.2 g/dL (ref 30.0–36.0)
MCV: 86.4 fL (ref 80.0–100.0)
Platelets: 266 10*3/uL (ref 150–400)
RBC: 5.35 MIL/uL (ref 4.22–5.81)
RDW: 12.4 % (ref 11.5–15.5)
WBC: 6 10*3/uL (ref 4.0–10.5)
nRBC: 0 % (ref 0.0–0.2)

## 2020-03-18 LAB — COMPREHENSIVE METABOLIC PANEL
ALT: 18 U/L (ref 0–44)
AST: 20 U/L (ref 15–41)
Albumin: 4.8 g/dL (ref 3.5–5.0)
Alkaline Phosphatase: 66 U/L (ref 38–126)
Anion gap: 10 (ref 5–15)
BUN: 16 mg/dL (ref 6–20)
CO2: 25 mmol/L (ref 22–32)
Calcium: 9.4 mg/dL (ref 8.9–10.3)
Chloride: 103 mmol/L (ref 98–111)
Creatinine, Ser: 0.99 mg/dL (ref 0.61–1.24)
GFR, Estimated: >60 mL/min (ref >60)
Glucose, Bld: 98 mg/dL (ref 70–99)
Potassium: 3.8 mmol/L (ref 3.5–5.1)
Sodium: 138 mmol/L (ref 135–145)
Total Bilirubin: 0.9 mg/dL (ref 0.3–1.2)
Total Protein: 8.2 g/dL — ABNORMAL HIGH (ref 6.5–8.1)

## 2020-03-18 LAB — RESPIRATORY PANEL BY RT PCR (FLU A&B, COVID)
Influenza A by PCR: NEGATIVE
Influenza B by PCR: NEGATIVE
SARS Coronavirus 2 by RT PCR: NEGATIVE

## 2020-03-18 LAB — DIFFERENTIAL
Abs Immature Granulocytes: 0.02 10*3/uL (ref 0.00–0.07)
Basophils Absolute: 0.1 10*3/uL (ref 0.0–0.1)
Basophils Relative: 1 %
Eosinophils Absolute: 0.2 10*3/uL (ref 0.0–0.5)
Eosinophils Relative: 3 %
Immature Granulocytes: 0 %
Lymphocytes Relative: 31 %
Lymphs Abs: 1.9 10*3/uL (ref 0.7–4.0)
Monocytes Absolute: 0.4 10*3/uL (ref 0.1–1.0)
Monocytes Relative: 6 %
Neutro Abs: 3.5 10*3/uL (ref 1.7–7.7)
Neutrophils Relative %: 59 %

## 2020-03-18 LAB — APTT: aPTT: 29 seconds (ref 24–36)

## 2020-03-18 LAB — GLUCOSE, CAPILLARY: Glucose-Capillary: 91 mg/dL (ref 70–99)

## 2020-03-18 LAB — PROTIME-INR
INR: 1 (ref 0.8–1.2)
Prothrombin Time: 12.6 seconds (ref 11.4–15.2)

## 2020-03-18 MED ORDER — IBUPROFEN 400 MG PO TABS: 600.0000 mg | ORAL_TABLET | Freq: Three times a day (TID) | ORAL | Status: DC | PRN

## 2020-03-18 MED ORDER — ASPIRIN 81 MG PO CHEW
324.0000 mg | CHEWABLE_TABLET | Freq: Once | ORAL | Status: AC
  Administered 2020-03-18: 324 mg via ORAL
  Filled 2020-03-18: qty 4

## 2020-03-18 MED ORDER — GADOBUTROL 1 MMOL/ML IV SOLN
9.0000 mL | Freq: Once | INTRAVENOUS | Status: AC | PRN
  Administered 2020-03-18: 9 mL via INTRAVENOUS
  Filled 2020-03-18: qty 10

## 2020-03-18 MED ORDER — SODIUM CHLORIDE 0.9% FLUSH
3.0000 mL | Freq: Two times a day (BID) | INTRAVENOUS | Status: DC
  Administered 2020-03-19: 3 mL via INTRAVENOUS

## 2020-03-18 MED ORDER — IOHEXOL 350 MG/ML SOLN
75.0000 mL | Freq: Once | INTRAVENOUS | Status: AC | PRN
  Administered 2020-03-18: 75 mL via INTRAVENOUS

## 2020-03-18 MED ORDER — ASPIRIN EC 81 MG PO TBEC
81.0000 mg | DELAYED_RELEASE_TABLET | Freq: Every day | ORAL | Status: DC
  Administered 2020-03-19: 81 mg via ORAL
  Filled 2020-03-18: qty 1

## 2020-03-18 MED ORDER — SODIUM CHLORIDE 0.9% FLUSH: 3.0000 mL | Freq: Once | INTRAVENOUS | Status: DC

## 2020-03-18 MED ORDER — ACETAMINOPHEN 500 MG PO TABS: 1000.0000 mg | ORAL_TABLET | Freq: Four times a day (QID) | ORAL | Status: DC | PRN

## 2020-03-18 MED ORDER — ONDANSETRON HCL 4 MG PO TABS: 4.0000 mg | ORAL_TABLET | Freq: Four times a day (QID) | ORAL | Status: DC | PRN

## 2020-03-18 MED ORDER — ENOXAPARIN SODIUM 40 MG/0.4ML ~~LOC~~ SOLN: 40.0000 mg | SUBCUTANEOUS | Status: DC

## 2020-03-18 MED ORDER — METOPROLOL TARTRATE 5 MG/5ML IV SOLN: 5.0000 mg | Freq: Four times a day (QID) | INTRAVENOUS | Status: DC | PRN

## 2020-03-18 MED ORDER — ONDANSETRON HCL 4 MG/2ML IJ SOLN: 4.0000 mg | Freq: Four times a day (QID) | INTRAMUSCULAR | Status: DC | PRN

## 2020-03-18 NOTE — ED Triage Notes (Signed)
Pt comes POV with left sided facial numbness and some left sided body numbness with headache since 8am. Pt reports having a TIA before. Has some strength to left arm but feels numb. Leg has gotten better. Pt ambulatory. Pt also nauseous.

## 2020-03-18 NOTE — ED Provider Notes (Signed)
[facility] Emergency Department Provider Note   ____________________________________________   First MD Initiated Contact with Patient 03/18/20 1320     (approximate)  I have reviewed the triage vital signs and the nursing notes.   HISTORY  Chief Complaint Numbness    HPI Tony [name] is a 29 y.o. male history of previous colitis, seizure disorder, and colonic adenoma with polypectomy  Patient reports that  he was driving today at 5:80 AM when he started feeling a little lightheaded, hard to describe but then noticed that his left leg and left arm both felt numb and his left arm felt a bit weak.  No changes in speech.  Associated with a mild feeling of a headache across the front of his head both sides.  No fevers or chills.  No chest pain.  No shortness of breath nausea or vomiting  Does report that he is occasionally also having some sharp intermittent abdominal pains but those been present for months, and he has started working to get follow-up with gastroenterology for this  No fevers.  No neck stiffness.  No trouble breathing.  Numbness is still present over the left arm and left leg weakness seems to have improved.  Denies any seizure activity  Past Medical History:  Diagnosis Date  . Anemia    vitamin d and b12 deficiency  . Colitis   . Headache    multiple/day.  . Seizures ([place])    seizure like activity.  has not had any symptoms since Topamax (march 2020)  . Tubular adenoma of colon    had polypectomy     Patient Active Problem List   Diagnosis Date Noted  . Change in bowel function   . Seizure-like activity ([place]) 02/08/2019  . Inflammatory bowel disease 02/08/2019  . Back muscle spasm 02/08/2019  . Depression 02/08/2019    Past Surgical History:  Procedure Laterality Date  . COLONOSCOPY WITH PROPOFOL N/A 03/08/2019   Procedure: COLONOSCOPY WITH PROPOFOL;  Surgeon: Lucilla Lame, MD;  Location: [place];  Service:  Endoscopy;  Laterality: N/A;  . INGUINAL HERNIA REPAIR Right 07/30/2019   Procedure: LAPAROSCOPIC INGUINAL HERNIA;  Surgeon: Olean Ree, MD;  Location: [facility];  Service: General;  Laterality: Right;  . LIPOMA EXCISION  2019   lower right back  . POLYPECTOMY  03/08/2019   Procedure: POLYPECTOMY;  Surgeon: Lucilla Lame, MD;  Location: [place];  Service: Endoscopy;;  . TONSILLECTOMY    . VASECTOMY  04/25/2019    Prior to Admission medications   Medication Sig Start Date End Date Taking? Authorizing Provider  acetaminophen (TYLENOL) 500 MG tablet Take 1,000 mg by mouth every 6 (six) hours as needed (for pain.).    [provider]  Cholecalciferol (VITAMIN D3) 50 MCG (2000 UT) TABS Take 2,000 Units by mouth at bedtime.    [provider]  cyanocobalamin (,VITAMIN B-12,) 1000 MCG/ML injection inject 71mL IM once a week for four weeks then once a month for four months. 03/26/19   [provider]  ibuprofen (ADVIL) 600 MG tablet Take 1 tablet (600 mg total) by mouth every 8 (eight) hours as needed for mild pain or moderate pain. 07/30/19   Olean Ree, MD  topiramate (TOPAMAX) 25 MG tablet Take 50 mg by mouth at bedtime.  03/21/19   [provider]  Mesalamine (ASACOL) 400 MG CPDR DR capsule Take 2 capsules (800 mg total) by mouth 3 (three) times daily. 03/14/19 07/08/19  Lucilla Lame, MD  Allergies Patient has no known allergies.  Family History  Problem Relation Age of Onset  . Crohn's disease Mother   . Healthy Father     Social History Social History   Tobacco Use  . Smoking status: Former Smoker    Years: 2.00    Types: Cigarettes    Quit date: 02/2017    Years since quitting: 3.1  . Smokeless tobacco: Never Used  . Tobacco comment: 1 pack per month  Vaping Use  . Vaping Use: Former  . Start date: 08/04/2016  . Quit date: 02/04/2017  Substance Use Topics  . Alcohol use: Yes    Alcohol/week: 3.0 standard drinks    Types: 3 Cans  of beer per week    Comment:    . Drug use: Never    Review of Systems Constitutional: No fever/chills Eyes: No visual changes except feels like vision seems a little abnormal or fuzzy over the left side. ENT: No sore throat. Cardiovascular: Denies chest pain. Respiratory: Denies shortness of breath. Gastrointestinal: No abdominal pain.   Genitourinary: Negative for dysuria. Musculoskeletal: Negative for back pain. Skin: Negative for rash. Neurological: Negative for seizure or tremulousness.  Reports normally his seizures require her because his full body to shake and tremor and this did not occur.    ____________________________________________   PHYSICAL EXAM:  VITAL SIGNS: ED Triage Vitals [03/18/20 1231]  Enc Vitals Group     BP 124/75     Pulse Rate 77     Resp 18     Temp 98.6 F (37 C)     Temp Source Oral     SpO2 96 %     Weight 200 lb (90.7 kg)     Height 5\' 10"  (1.778 m)     Head Circumference      Peak Flow      Pain Score 0     Pain Loc      Pain Edu?      Excl. in [place]?     Constitutional: Alert and oriented. Well appearing and in no acute distress. Eyes: Conjunctivae are normal. Head: Atraumatic. Nose: No congestion/rhinnorhea. Mouth/Throat: Mucous membranes are moist. Neck: No stridor.  Cardiovascular: Normal rate, regular rhythm. Grossly normal heart sounds.  Good peripheral circulation. Respiratory: Normal respiratory effort.  No retractions. Lungs CTAB. Gastrointestinal: Soft and nontender. No distention. Musculoskeletal: No lower extremity tenderness nor edema. Neurologic: NIH score equals 2.  Mild weakness left upper extremity compared to the right, though he is able to work against gravity he does seem to have a slight tremulousness or very mild drift in the left arm.  Additionally he has mild decreased sensation over the left arm left face left leg. Skin:  Skin is warm, dry and intact. No rash noted. Psychiatric: Mood and affect are normal.  Speech and behavior are normal.  ____________________________________________   LABS (all labs ordered are listed, but only abnormal results are displayed)  Labs Reviewed  COMPREHENSIVE METABOLIC PANEL - Abnormal; Notable for the following components:      Result Value   Total Protein 8.2 (*)    All other components within normal limits  RESPIRATORY PANEL BY RT PCR (FLU A&B, COVID)  PROTIME-INR  APTT  CBC  DIFFERENTIAL  GLUCOSE, CAPILLARY  VITAMIN B12  VITAMIN D 25 HYDROXY (VIT D DEFICIENCY, FRACTURES)  I-STAT CREATININE, ED  CBG MONITORING, ED   ____________________________________________  EKG  ED ECG REPORT I, Delman Kitten, the attending physician, personally viewed and interpreted this  ECG.  Date: 03/18/2020 EKG Time: 1240 Rate: 75 Rhythm: normal sinus rhythm QRS Axis: normal Intervals: normal ST/T Wave abnormalities: normal Narrative Interpretation: no evidence of acute ischemia  ____________________________________________  RADIOLOGY  CT Angio Head W or Wo Contrast  Result Date: 03/18/2020 CLINICAL DATA:  Left-sided facial and body numbness with headache. History of TIA. EXAM: CT ANGIOGRAPHY HEAD AND NECK TECHNIQUE: Multidetector CT imaging of the head and neck was performed using the standard protocol during bolus administration of intravenous contrast. Multiplanar CT image reconstructions and MIPs were obtained to evaluate the vascular anatomy. Carotid stenosis measurements (when applicable) are obtained utilizing NASCET criteria, using the distal internal carotid diameter as the denominator. CONTRAST:  33mL OMNIPAQUE IOHEXOL 350 MG/ML SOLN COMPARISON:  None. FINDINGS: CTA NECK FINDINGS Aortic arch: Standard 3 vessel aortic arch with widely patent arch vessel origins. Right carotid system: Patent and smooth without evidence of stenosis or dissection. Left carotid system: Patent and smooth without evidence of stenosis or dissection. Vertebral arteries: Patent and  smooth without evidence of stenosis or dissection. Mildly to moderately dominant left vertebral artery. Skeleton: No acute osseous abnormality or suspicious osseous lesion. Other neck: No evidence of cervical lymphadenopathy or mass. Upper chest: Clear lung apices. Review of the MIP images confirms the above findings CTA HEAD FINDINGS Anterior circulation: The internal carotid arteries are patent from skull base crash that the internal carotid arteries are widely patent from skull base to carotid termini. ACAs and MCAs are patent with diffusely irregular appearance of the branch vessels felt to be technical in nature and related to small vessel size and image noise. No proximal branch occlusion or flow limiting A1 or M1 stenosis is evident. No aneurysm is identified. Posterior circulation: The intracranial vertebral arteries are widely patent to the basilar. Patent PICA and SCA origins are seen bilaterally. The basilar artery is widely patent. Posterior communicating arteries are diminutive or absent. Both PCAs are patent without evidence of a flow limiting proximal stenosis. No aneurysm is identified. Venous sinuses: Patent. Anatomic variants: None. Review of the MIP images confirms the above findings IMPRESSION: Negative head and neck CTA. Electronically Signed   By: Logan Bores M.D.   On: 03/18/2020 14:56   CT HEAD WO CONTRAST  Result Date: 03/18/2020 CLINICAL DATA:  Neuro deficit, acute, stroke suspected. Additional history provided: Patient reports left-sided facial numbness, visual disturbance, headache, dizziness. EXAM: CT HEAD WITHOUT CONTRAST TECHNIQUE: Contiguous axial images were obtained from the base of the skull through the vertex without intravenous contrast. COMPARISON:  Head CT 07/26/2018 FINDINGS: Brain: Cerebral volume is normal. There is no acute intracranial hemorrhage. No demarcated cortical infarct. No extra-axial fluid collection. No evidence of intracranial mass. No midline shift.  Vascular: No hyperdense vessel. Skull: Normal. Negative for fracture or focal lesion. Sinuses/Orbits: Visualized orbits show no acute finding. Moderate bilateral ethmoid sinus mucosal thickening. Mild bilateral sphenoid sinus mucosal thickening. No significant mastoid effusion. IMPRESSION: Unremarkable non-contrast CT appearance of the brain. No evidence of acute intracranial abnormality. Moderate ethmoid sinus mucosal thickening. Mild sphenoid sinus mucosal thickening. Electronically Signed   By: Kellie Simmering DO   On: 03/18/2020 13:36   CT Angio Neck W and/or Wo Contrast  Result Date: 03/18/2020 CLINICAL DATA:  Left-sided facial and body numbness with headache. History of TIA. EXAM: CT ANGIOGRAPHY HEAD AND NECK TECHNIQUE: Multidetector CT imaging of the head and neck was performed using the standard protocol during bolus administration of intravenous contrast. Multiplanar CT image reconstructions and MIPs were obtained to  evaluate the vascular anatomy. Carotid stenosis measurements (when applicable) are obtained utilizing NASCET criteria, using the distal internal carotid diameter as the denominator. CONTRAST:  90mL OMNIPAQUE IOHEXOL 350 MG/ML SOLN COMPARISON:  None. FINDINGS: CTA NECK FINDINGS Aortic arch: Standard 3 vessel aortic arch with widely patent arch vessel origins. Right carotid system: Patent and smooth without evidence of stenosis or dissection. Left carotid system: Patent and smooth without evidence of stenosis or dissection. Vertebral arteries: Patent and smooth without evidence of stenosis or dissection. Mildly to moderately dominant left vertebral artery. Skeleton: No acute osseous abnormality or suspicious osseous lesion. Other neck: No evidence of cervical lymphadenopathy or mass. Upper chest: Clear lung apices. Review of the MIP images confirms the above findings CTA HEAD FINDINGS Anterior circulation: The internal carotid arteries are patent from skull base crash that the internal carotid  arteries are widely patent from skull base to carotid termini. ACAs and MCAs are patent with diffusely irregular appearance of the branch vessels felt to be technical in nature and related to small vessel size and image noise. No proximal branch occlusion or flow limiting A1 or M1 stenosis is evident. No aneurysm is identified. Posterior circulation: The intracranial vertebral arteries are widely patent to the basilar. Patent PICA and SCA origins are seen bilaterally. The basilar artery is widely patent. Posterior communicating arteries are diminutive or absent. Both PCAs are patent without evidence of a flow limiting proximal stenosis. No aneurysm is identified. Venous sinuses: Patent. Anatomic variants: None. Review of the MIP images confirms the above findings IMPRESSION: Negative head and neck CTA. Electronically Signed   By: Logan Bores M.D.   On: 03/18/2020 14:56      CT angiogram head and neck as well as CT of the head are reviewed, no acute findings ____________________________________________   PROCEDURES  Procedure(s) performed: None  Procedures  Critical Care performed: Yes, see critical care note(s)  CRITICAL CARE Performed by: Delman Kitten   Total critical care time: 30 minutes  Critical care time was exclusive of separately billable procedures and treating other patients.  Critical care was necessary to treat or prevent imminent or life-threatening deterioration.  Critical care was time spent personally by me on the following activities: development of treatment plan with patient and/or surrogate as well as nursing, discussions with consultants, evaluation of patient's response to treatment, examination of patient, obtaining history from patient or surrogate, ordering and performing treatments and interventions, ordering and review of laboratory studies, ordering and review of radiographic studies, pulse oximetry and re-evaluation of patient's condition.  Patient presents  with complaint of acute neurologic deficits. On exam does have positive NIH score, but in review of history and review of imaging appears that he is outside of TPA window, and I suspect that the etiology is unlikely to be ischemic stroke and may be more related to other etiologies such as migraine, seizure disorder, or other etiology  Neurology consult appreciated ____________________________________________   INITIAL IMPRESSION / ASSESSMENT AND PLAN / ED COURSE  Pertinent labs & imaging results that were available during my care of the patient were reviewed by me and considered in my medical decision making (see chart for details).   Patient presents for mild headache associated with focal left-sided neurologic symptoms.  Symptoms started at 8:30 AM, the time of my evaluation of the patient that just after 1 PM he is outside of any TPA like window.  However, he also has a history of seizure disorder and he is young and age, has  also very mild headache.  His symptoms seem to be improving and he mostly has now at this point his decreased sensation may be some very mild left arm weakness.  Is difficult for me to say that this is actually due to a stroke, also on my differential would certainly be seizure, migraine.  No meningismus no infectious symptoms.  Given his symptomatology, will give aspirin if his CT of the head is normal.  His CBC is normal.  Will request consult to neurology  Clinical Course as of Mar 18 1533  Wed Mar 18, 2020  1344 And case discussed in consult placed with Dr. Doy Mince. She advises obtaining CT angio head neck and she will see patient for consult. Dr. Mordecai Maes recommends aspirin   [MQ]    Clinical Course User Index [MQ] Delman Kitten, MD    ----------------------------------------- 3:35 PM on 03/18/2020 -----------------------------------------  Discussed with hospitalist, patient will be admitted for further  work-up ____________________________________________   FINAL CLINICAL IMPRESSION(S) / ED DIAGNOSES  Final diagnoses:  Paresthesia of left upper and lower extremity        Note:  This document was prepared using Dragon voice recognition software and may include unintentional dictation errors       Delman Kitten, MD 03/18/20 1535

## 2020-03-18 NOTE — ED Notes (Signed)
Patient returned from MRI.

## 2020-03-18 NOTE — ED Notes (Signed)
Patient returned from EEG, now changed into gown, MRI on way to take patient.

## 2020-03-18 NOTE — ED Notes (Signed)
Admitting provider at bedside.

## 2020-03-18 NOTE — H&P (Signed)
History and Physical   Tony [name]:[id number] DOB: 04/26/1991 DOA: 03/18/2020  Referring MD/NP/PA: EDP, Dr. Jacqualine Code PCP: Glean Hess, MD Outpatient Specialists: [facility]; Neurology Dr. Manuella Ghazi Patient coming from: Home  Chief Complaint: Dizziness, left weakness and numbness, mental clouding  HPI: Tony [name] is a 29 y.o. male with a history of seizure-like activity (seizure disorder vs. nonepileptic) initially 2017, concussions, tubular adenoma who presented to the ED 10/13 with multiple complaints consistent with prior episodes that could possibly be due to seizures vs. migraines vs. pseudoseizures. These symptoms began on his drive to work at 8am consisting of lightheadedness, mild headache bilaterally that didn't remit once he got to work. Gradually began noticing abnormal sensation on the left side of the body (face, torso, arm, leg, foot) that remained constant through lunch when he decided to present to the ED when symptoms improved but have not yet resolved. Of note he is from [place], moved to [place], [place] a year ago, has a 30 year old and 13 month old with his wife, works more overtime lately and sleeps about 4 hours per night, is under a lot of stress.  ED Course: VSS, labs unremarkable, CT head negative, no LVO on CTA head and neck. Neurology consulted, recommending MRI, EEG, telemetry, and observation.   Review of Systems: Denies fever, and per HPI. All others reviewed and are negative.   Past Medical History:  Diagnosis Date  . Anemia    vitamin d and b12 deficiency  . Colitis   . Headache    multiple/day.  . Seizures ([place])    seizure like activity.  has not had any symptoms since Topamax (march 2020)  . Tubular adenoma of colon    had polypectomy    Past Surgical History:  Procedure Laterality Date  . COLONOSCOPY WITH PROPOFOL N/A 03/08/2019   Procedure: COLONOSCOPY WITH PROPOFOL;  Surgeon: Lucilla Lame, MD;  Location: [facility];   Service: Endoscopy;  Laterality: N/A;  . INGUINAL HERNIA REPAIR Right 07/30/2019   Procedure: LAPAROSCOPIC INGUINAL HERNIA;  Surgeon: Olean Ree, MD;  Location: [facility];  Service: General;  Laterality: Right;  . LIPOMA EXCISION  2019   lower right back  . POLYPECTOMY  03/08/2019   Procedure: POLYPECTOMY;  Surgeon: Lucilla Lame, MD;  Location: [facility];  Service: Endoscopy;;  . TONSILLECTOMY    . VASECTOMY  04/25/2019   - Vapes without nicotine, previously drank on weekends, none lately. 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  reports that he quit smoking about 3 years ago. His smoking use included cigarettes. He quit after 2.00 years of use. He has never used smokeless tobacco. He reports current alcohol use of about 3.0 standard drinks of alcohol per week. He reports that he does not use drugs. No Known Allergies Family History  Problem Relation Age of Onset  . Crohn's disease Mother   . Healthy Father    - Family history otherwise reviewed and not pertinent.  Prior to Admission medications   Medication Sig Start Date End Date Taking? Authorizing Provider  acetaminophen (TYLENOL) 500 MG tablet Take 1,000 mg by mouth every 6 (six) hours as needed (for pain.).    [provider]  Cholecalciferol (VITAMIN D3) 50 MCG (2000 UT) TABS Take 2,000 Units by mouth at bedtime.    [provider]  cyanocobalamin (,VITAMIN B-12,) 1000 MCG/ML injection inject 5mL IM  once a week for four weeks then once a month for four months. 03/26/19   [provider]  ibuprofen (ADVIL) 600 MG tablet Take 1 tablet (600 mg total) by mouth every 8 (eight) hours as needed for mild pain or moderate pain. 07/30/19   Olean Ree, MD  topiramate (TOPAMAX) 25 MG tablet Take 50 mg by mouth at bedtime.  03/21/19   [provider]  Mesalamine (ASACOL) 400 MG CPDR DR capsule Take 2  capsules (800 mg total) by mouth 3 (three) times daily. 03/14/19 07/08/19  Lucilla Lame, MD    Physical Exam: Vitals:   03/18/20 1231 03/18/20 1707 03/18/20 1840  BP: 124/75 123/73 130/86  Pulse: 77 78 78  Resp: 18 16 16   Temp: 98.6 F (37 C)    TempSrc: Oral    SpO2: 96% 98% 97%  Weight: 90.7 kg    Height: 5\' 10"  (1.778 m)     Constitutional: 29 y.o. male in no distress, calm demeanor Eyes: Lids and conjunctivae normal, PERRL ENMT: Mucous membranes are moist.  Neck: normal, supple, no masses, no thyromegaly Respiratory: Non-labored breathing without accessory muscle use. Clear breath sounds to auscultation bilaterally Cardiovascular: Regular rate and rhythm, no murmurs, rubs, or gallops. No carotid bruits. No JVD. No LE edema. Abdomen: Normoactive bowel sounds. No tenderness, non-distended, and no masses palpated. No hepatosplenomegaly. GU: No indwelling catheter Musculoskeletal: No clubbing / cyanosis. No joint deformity upper and lower extremities. Good ROM, no contractures. Normal muscle tone.  Skin: Warm, dry. No rashes, wounds, or ulcers on visualized skin. Neurologic: CN II-XII grossly intact. Speech normal. No focal deficits in motor strength in extremities.  Psychiatric: Alert and oriented x3. Normal judgment and insight. Mood euthymic with congruent affect.   Labs on Admission: I have personally reviewed following labs and imaging studies  CBC: Recent Labs  Lab 03/18/20 1238  WBC 6.0  NEUTROABS 3.5  HGB 15.8  HCT 46.2  MCV 86.4  PLT 283   Basic Metabolic Panel: Recent Labs  Lab 03/18/20 1238  NA 138  K 3.8  CL 103  CO2 25  GLUCOSE 98  BUN 16  CREATININE 0.99  CALCIUM 9.4   GFR: Estimated Creatinine Clearance: 124.7 mL/min (by C-G formula based on SCr of 0.99 mg/dL). Liver Function Tests: Recent Labs  Lab 03/18/20 1238  AST 20  ALT 18  ALKPHOS 66  BILITOT 0.9  PROT 8.2*  ALBUMIN 4.8   No results for input(s): LIPASE, AMYLASE in the last 168  hours. No results for input(s): AMMONIA in the last 168 hours. Coagulation Profile: Recent Labs  Lab 03/18/20 1238  INR 1.0   Cardiac Enzymes: No results for input(s): CKTOTAL, CKMB, CKMBINDEX, TROPONINI in the last 168 hours. BNP (last 3 results) No results for input(s): PROBNP in the last 8760 hours. HbA1C: No results for input(s): HGBA1C in the last 72 hours. CBG: Recent Labs  Lab 03/18/20 1238  GLUCAP 91   Lipid Profile: No results for input(s): CHOL, HDL, LDLCALC, TRIG, CHOLHDL, LDLDIRECT in the last 72 hours. Thyroid Function Tests: No results for input(s): TSH, T4TOTAL, FREET4, T3FREE, THYROIDAB in the last 72 hours. Anemia Panel: No results for input(s): VITAMINB12, FOLATE, FERRITIN, TIBC, IRON, RETICCTPCT in the last 72 hours. Urine analysis:    Component Value Date/Time   COLORURINE YELLOW (A) 10/15/2019 0500   APPEARANCEUR CLEAR (A) 10/15/2019 0500   APPEARANCEUR Clear 07/09/2019 1452   LABSPEC >1.046 (H) 10/15/2019 0500   PHURINE 5.0 10/15/2019 0500  GLUCOSEU NEGATIVE 10/15/2019 0500   HGBUR NEGATIVE 10/15/2019 0500   BILIRUBINUR NEGATIVE 10/15/2019 0500   BILIRUBINUR Negative 07/09/2019 1452   KETONESUR NEGATIVE 10/15/2019 0500   PROTEINUR NEGATIVE 10/15/2019 0500   NITRITE NEGATIVE 10/15/2019 0500   LEUKOCYTESUR NEGATIVE 10/15/2019 0500    Recent Results (from the past 240 hour(s))  Respiratory Panel by RT PCR (Flu A&B, Covid) - Nasopharyngeal Swab     Status: None   Collection Time: 03/18/20  2:57 PM   Specimen: Nasopharyngeal Swab  Result Value Ref Range Status   SARS Coronavirus 2 by RT PCR NEGATIVE NEGATIVE Final    Comment: (NOTE) SARS-CoV-2 target nucleic acids are NOT DETECTED.  The SARS-CoV-2 RNA is generally detectable in upper respiratoy specimens during the acute phase of infection. The lowest concentration of SARS-CoV-2 viral copies this assay can detect is 131 copies/mL. A negative result does not preclude SARS-Cov-2 infection and  should not be used as the sole basis for treatment or other patient management decisions. A negative result may occur with  improper specimen collection/handling, submission of specimen other than nasopharyngeal swab, presence of viral mutation(s) within the areas targeted by this assay, and inadequate number of viral copies (<131 copies/mL). A negative result must be combined with clinical observations, patient history, and epidemiological information. The expected result is Negative.  Fact Sheet for Patients:  PinkCheek.be  Fact Sheet for Healthcare Providers:  GravelBags.it  This test is no t yet approved or cleared by the [place] FDA and  has been authorized for detection and/or diagnosis of SARS-CoV-2 by FDA under an Emergency Use Authorization (EUA). This EUA will remain  in effect (meaning this test can be used) for the duration of the COVID-19 declaration under Section 564(b)(1) of the Act, 21 U.S.C. section 360bbb-3(b)(1), unless the authorization is terminated or revoked sooner.     Influenza A by PCR NEGATIVE NEGATIVE Final   Influenza B by PCR NEGATIVE NEGATIVE Final    Comment: (NOTE) The Xpert Xpress SARS-CoV-2/FLU/RSV assay is intended as an aid in  the diagnosis of influenza from Nasopharyngeal swab specimens and  should not be used as a sole basis for treatment. Nasal washings and  aspirates are unacceptable for Xpert Xpress SARS-CoV-2/FLU/RSV  testing.  Fact Sheet for Patients: PinkCheek.be  Fact Sheet for Healthcare Providers: GravelBags.it  This test is not yet approved or cleared by the [place] FDA and  has been authorized for detection and/or diagnosis of SARS-CoV-2 by  FDA under an Emergency Use Authorization (EUA). This EUA will remain  in effect (meaning this test can be used) for the duration of the  Covid-19 declaration  under Section 564(b)(1) of the Act, 21  U.S.C. section 360bbb-3(b)(1), unless the authorization is  terminated or revoked. Performed at [facility], [address] [place], [place] [address]      Radiological Exams on Admission: CT Angio Head W or Wo Contrast  Result Date: 03/18/2020 CLINICAL DATA:  Left-sided facial and body numbness with headache. History of TIA. EXAM: CT ANGIOGRAPHY HEAD AND NECK TECHNIQUE: Multidetector CT imaging of the head and neck was performed using the standard protocol during bolus administration of intravenous contrast. Multiplanar CT image reconstructions and MIPs were obtained to evaluate the vascular anatomy. Carotid stenosis measurements (when applicable) are obtained utilizing NASCET criteria, using the distal internal carotid diameter as the denominator. CONTRAST:  24mL OMNIPAQUE IOHEXOL 350 MG/ML SOLN COMPARISON:  None. FINDINGS: CTA NECK FINDINGS Aortic arch: Standard 3 vessel aortic arch with widely  patent arch vessel origins. Right carotid system: Patent and smooth without evidence of stenosis or dissection. Left carotid system: Patent and smooth without evidence of stenosis or dissection. Vertebral arteries: Patent and smooth without evidence of stenosis or dissection. Mildly to moderately dominant left vertebral artery. Skeleton: No acute osseous abnormality or suspicious osseous lesion. Other neck: No evidence of cervical lymphadenopathy or mass. Upper chest: Clear lung apices. Review of the MIP images confirms the above findings CTA HEAD FINDINGS Anterior circulation: The internal carotid arteries are patent from skull base crash that the internal carotid arteries are widely patent from skull base to carotid termini. ACAs and MCAs are patent with diffusely irregular appearance of the branch vessels felt to be technical in nature and related to small vessel size and image noise. No proximal branch occlusion or flow limiting A1 or M1 stenosis is  evident. No aneurysm is identified. Posterior circulation: The intracranial vertebral arteries are widely patent to the basilar. Patent PICA and SCA origins are seen bilaterally. The basilar artery is widely patent. Posterior communicating arteries are diminutive or absent. Both PCAs are patent without evidence of a flow limiting proximal stenosis. No aneurysm is identified. Venous sinuses: Patent. Anatomic variants: None. Review of the MIP images confirms the above findings IMPRESSION: Negative head and neck CTA. Electronically Signed   By: Logan Bores M.D.   On: 03/18/2020 14:56   CT HEAD WO CONTRAST  Result Date: 03/18/2020 CLINICAL DATA:  Neuro deficit, acute, stroke suspected. Additional history provided: Patient reports left-sided facial numbness, visual disturbance, headache, dizziness. EXAM: CT HEAD WITHOUT CONTRAST TECHNIQUE: Contiguous axial images were obtained from the base of the skull through the vertex without intravenous contrast. COMPARISON:  Head CT 07/26/2018 FINDINGS: Brain: Cerebral volume is normal. There is no acute intracranial hemorrhage. No demarcated cortical infarct. No extra-axial fluid collection. No evidence of intracranial mass. No midline shift. Vascular: No hyperdense vessel. Skull: Normal. Negative for fracture or focal lesion. Sinuses/Orbits: Visualized orbits show no acute finding. Moderate bilateral ethmoid sinus mucosal thickening. Mild bilateral sphenoid sinus mucosal thickening. No significant mastoid effusion. IMPRESSION: Unremarkable non-contrast CT appearance of the brain. No evidence of acute intracranial abnormality. Moderate ethmoid sinus mucosal thickening. Mild sphenoid sinus mucosal thickening. Electronically Signed   By: Kellie Simmering DO   On: 03/18/2020 13:36   CT Angio Neck W and/or Wo Contrast  Result Date: 03/18/2020 CLINICAL DATA:  Left-sided facial and body numbness with headache. History of TIA. EXAM: CT ANGIOGRAPHY HEAD AND NECK TECHNIQUE:  Multidetector CT imaging of the head and neck was performed using the standard protocol during bolus administration of intravenous contrast. Multiplanar CT image reconstructions and MIPs were obtained to evaluate the vascular anatomy. Carotid stenosis measurements (when applicable) are obtained utilizing NASCET criteria, using the distal internal carotid diameter as the denominator. CONTRAST:  48mL OMNIPAQUE IOHEXOL 350 MG/ML SOLN COMPARISON:  None. FINDINGS: CTA NECK FINDINGS Aortic arch: Standard 3 vessel aortic arch with widely patent arch vessel origins. Right carotid system: Patent and smooth without evidence of stenosis or dissection. Left carotid system: Patent and smooth without evidence of stenosis or dissection. Vertebral arteries: Patent and smooth without evidence of stenosis or dissection. Mildly to moderately dominant left vertebral artery. Skeleton: No acute osseous abnormality or suspicious osseous lesion. Other neck: No evidence of cervical lymphadenopathy or mass. Upper chest: Clear lung apices. Review of the MIP images confirms the above findings CTA HEAD FINDINGS Anterior circulation: The internal carotid arteries are patent from skull base crash that  the internal carotid arteries are widely patent from skull base to carotid termini. ACAs and MCAs are patent with diffusely irregular appearance of the branch vessels felt to be technical in nature and related to small vessel size and image noise. No proximal branch occlusion or flow limiting A1 or M1 stenosis is evident. No aneurysm is identified. Posterior circulation: The intracranial vertebral arteries are widely patent to the basilar. Patent PICA and SCA origins are seen bilaterally. The basilar artery is widely patent. Posterior communicating arteries are diminutive or absent. Both PCAs are patent without evidence of a flow limiting proximal stenosis. No aneurysm is identified. Venous sinuses: Patent. Anatomic variants: None. Review of the MIP  images confirms the above findings IMPRESSION: Negative head and neck CTA. Electronically Signed   By: Logan Bores M.D.   On: 03/18/2020 14:56   MR BRAIN W WO CONTRAST  Result Date: 03/18/2020 CLINICAL DATA:  29 year old male presenting with neurologic deficit, headache, dizziness, visual disturbance, and left face numbness. EXAM: MRI HEAD WITHOUT AND WITH CONTRAST TECHNIQUE: Multiplanar, multiecho pulse sequences of the brain and surrounding structures were obtained without and with intravenous contrast. CONTRAST:  69mL GADAVIST GADOBUTROL 1 MMOL/ML IV SOLN COMPARISON:  Head CT and CTA head and neck earlier today. FINDINGS: Brain: Normal cerebral volume. No restricted diffusion to suggest acute infarction. No midline shift, mass effect, evidence of mass lesion, ventriculomegaly, extra-axial collection or acute intracranial hemorrhage. Cervicomedullary junction and pituitary are within normal limits. Normal gray and white matter signal throughout the brain. No encephalomalacia or chronic cerebral blood products identified. Thin slice coronal imaging of the temporal lobes also provided. Hippocampal formations appear symmetric and within normal limits. No mesial temporal lobe signal abnormality identified. No migrational abnormality identified. No abnormal enhancement identified. Medial right cerebellum developmental venous anomaly (normal variant). No dural thickening. Normal cavernous sinus. Vascular: Major intracranial vascular flow voids are preserved. The major dural venous sinuses are enhancing and appear to be patent. Skull and upper cervical spine: Negative. Sinuses/Orbits: Negative orbits. Bilateral paranasal sinus mucosal thickening and/or retention cysts. Small fluid level in the right sphenoid sinus. Symmetric nasal cavity mucosal thickening. Other: Trace mastoid air cell fluid. Negative visible nasopharynx. Visible internal auditory structures appear normal. Scalp and face appear negative.  IMPRESSION: 1. Normal MRI appearance of the brain. 2. Paranasal sinus inflammation, rhinosinusitis. Electronically Signed   By: Genevie Ann M.D.   On: 03/18/2020 18:04    EKG: Independently reviewed. NSR.  Assessment/Plan Active Problems:   Acute left-sided weakness   Acute left-sided weakness, abnormal sensation: No evidence of infarct or other acute findings on CT, CTA H/N, or MRI. DDx ictal/postictal, psychogenic, TIA. Symptoms have not abated completely at this time.  - Neurology consulted, recommended observation, cardiac monitoring, EEG (performed but pending).  - Passed swallow screen, start diet.  - PT/OT - ASA 325mg  now, 81mg  going forward per neurology. - Seizure precautions, neurochecks.  - Patient unable to drive, operate heavy machinery, perform activities at heights and participate in water activities until release by outpatient physician. - Not currently on topamax per pt.  History of vitamin B12 and vitamin D deficiency:  - Check levels. No anemia noted.   DVT prophylaxis: Lovenox  Code Status: Full  Family Communication: None at bedside Disposition Plan: Observation, likely DC home 10/14 if stable Consults called: Neurology, Dr. Doy Mince  Admission status: Observation    Patrecia Pour, MD [facility] [email or web address] 03/18/2020, 6:51 PM

## 2020-03-18 NOTE — ED Notes (Signed)
Neuro at bedside.

## 2020-03-18 NOTE — ED Notes (Signed)
Patient transported to MRI 

## 2020-03-18 NOTE — ED Notes (Signed)
Pt in MRI.

## 2020-03-18 NOTE — Consult Note (Signed)
Requesting Physician: Jacqualine Code    Chief Complaint: Left sided weakness/numbness  I have been asked by Dr. Jacqualine Code to see this patient in consultation for acute infarct.  HPI: Tony [name] is an 29 y.o. male with a history of seizure (?pseudoseizure), vitamin D and B12 deficiency, migraine who presents with complaint of left sided weakness and numbness.  Patient began to have seizures in 2017.  [place] and given the diagnosis of pseudoseizures.  Was seen again by Dr. Manuella Ghazi at [facility] when question was raised again.  Seizures described as "spells of generalized numbness, blurred, vision sudden headache, he becomes unresponsive, abnormal movements of his arms and legs, head turned to one side- after episode he feels confused, has soreness, muscle aches, and feels tired".  Patient reports no episodes of bowel or bladder incontinence and no episodes of tongue biting.  No previous focal complaints.  Reports on Tegretol and Topamax in the past.  Currently on no anticonvulsant therapy because he reports that it was not working.  Patient continues to drive.  Last episode about 3-4 months ago.   Reports that today while driving had acute onset of left sided numbness and weakness including the face, arm and leg.  Symptoms have improved somewhat but he has not returned to baseline.   Initial NIHSS documents at 0   Date last known well: Date: 03/18/2020 Time last known well: Time: 08:30 tPA Given: No: Outside time window for tPA  Past Medical History:  Diagnosis Date  . Anemia    vitamin d and b12 deficiency  . Colitis   . Headache    multiple/day.  . Seizures ([place])    seizure like activity.  has not had any symptoms since Topamax (march 2020)  . Tubular adenoma of colon    had polypectomy     Past Surgical History:  Procedure Laterality Date  . COLONOSCOPY WITH PROPOFOL N/A 03/08/2019   Procedure: COLONOSCOPY WITH PROPOFOL;  Surgeon: Lucilla Lame, MD;  Location: [place];  Service: Endoscopy;  Laterality: N/A;  . INGUINAL HERNIA REPAIR Right 07/30/2019   Procedure: LAPAROSCOPIC INGUINAL HERNIA;  Surgeon: Olean Ree, MD;  Location: [facility];  Service: General;  Laterality: Right;  . LIPOMA EXCISION  2019   lower right back  . POLYPECTOMY  03/08/2019   Procedure: POLYPECTOMY;  Surgeon: Lucilla Lame, MD;  Location: [place];  Service: Endoscopy;;  . TONSILLECTOMY    . VASECTOMY  04/25/2019    Family History  Problem Relation Age of Onset  . Crohn's disease Mother   . Healthy Father    Social History:  reports that he quit smoking about 3 years ago. His smoking use included cigarettes. He quit after 2.00 years of use. He has never used smokeless tobacco. He reports current alcohol use of about 3.0 standard drinks of alcohol per week. He reports that he does not use drugs.  Allergies: No Known Allergies  Medications: I have reviewed the patient's current medications. Prior to Admission medications   Medication Sig Start Date End Date Taking? Authorizing Provider  acetaminophen (TYLENOL) 500 MG tablet Take 1,000 mg by mouth every 6 (six) hours as needed (for pain.).    [provider]  Cholecalciferol (VITAMIN D3) 50 MCG (2000 UT) TABS Take 2,000 Units by mouth at bedtime.    [provider]  cyanocobalamin (,VITAMIN B-12,) 1000 MCG/ML injection inject 75mL IM once a week for four weeks then once a month for four months. 03/26/19  [provider]  ibuprofen (ADVIL) 600 MG tablet Take 1 tablet (600 mg total) by mouth every 8 (eight) hours as needed for mild pain or moderate pain. 07/30/19   Olean Ree, MD  topiramate (TOPAMAX) 25 MG tablet Take 50 mg by mouth at bedtime.  03/21/19   [provider]  Mesalamine (ASACOL) 400 MG CPDR DR capsule Take 2 capsules (800 mg total) by mouth 3 (three) times daily. 03/14/19 07/08/19  Lucilla Lame, MD    ROS: History obtained from the patient  General ROS: negative  for - chills, fatigue, fever, night sweats, weight gain or weight loss Psychological ROS: negative for - behavioral disorder, hallucinations, memory difficulties, mood swings or suicidal ideation Ophthalmic ROS: negative for - blurry vision, double vision, eye pain or loss of vision ENT ROS: negative for - epistaxis, nasal discharge, oral lesions, sore throat, tinnitus or vertigo Allergy and Immunology ROS: negative for - hives or itchy/watery eyes Hematological and Lymphatic ROS: negative for - bleeding problems, bruising or swollen lymph nodes Endocrine ROS: negative for - galactorrhea, hair pattern changes, polydipsia/polyuria or temperature intolerance Respiratory ROS: negative for - cough, hemoptysis, shortness of breath or wheezing Cardiovascular ROS: negative for - chest pain, dyspnea on exertion, edema or irregular heartbeat Gastrointestinal ROS: negative for - abdominal pain, diarrhea, hematemesis, nausea/vomiting or stool incontinence Genito-Urinary ROS: negative for - dysuria, hematuria, incontinence or urinary frequency/urgency Musculoskeletal ROS: negative for - joint swelling or muscular weakness Neurological ROS: as noted in HPI Dermatological ROS: negative for rash and skin lesion changes  Physical Examination: Blood pressure 124/75, pulse 77, temperature 98.6 F (37 C), temperature source Oral, resp. rate 18, height 5\' 10"  (1.778 m), weight 90.7 kg, SpO2 96 %.  HEENT-  Normocephalic, no lesions, without obvious abnormality.  Normal external eye and conjunctiva.  Normal TM's bilaterally.  Normal auditory canals and external ears. Normal external nose, mucus membranes and septum.  Normal pharynx. Cardiovascular- S1, S2 normal, pulses palpable throughout   Lungs- chest clear, no wheezing, rales, normal symmetric air entry Abdomen- soft, non-tender; bowel sounds normal; no masses,  no organomegaly Extremities- no edema Lymph-no adenopathy palpable Musculoskeletal-no joint  tenderness, deformity or swelling Skin-warm and dry, no hyperpigmentation, vitiligo, or suspicious lesions  Neurological Examination   Mental Status: Alert, oriented, thought content appropriate.  Speech fluent without evidence of aphasia.  Able to follow 3 step commands without difficulty. Cranial Nerves: II: Visual fields grossly normal, pupils equal, round, reactive to light and accommodation III,IV, VI: ptosis not present, extra-ocular motions intact bilaterally V,VII: right facial droop, facial light touch sensation decreased on the left VIII: hearing normal bilaterally IX,X: gag reflex present XI: bilateral shoulder shrug XII: midline tongue extension Motor: Right : Upper extremity   5/5    Left:     Upper extremity   5/5  Lower extremity   5/5     Lower extremity   4+/5 Tone and bulk:normal tone throughout; no atrophy noted Sensory: Pinprick and light touch decreased in the left upper and lower extremities Deep Tendon Reflexes: Symmetric throughout Plantars: Right: downgoing   Left: downgoing Cerebellar: Normal finger-to-nose and normal heel-to-shin testing bilaterally Gait: not tested due to safety concerns    Laboratory Studies:  Basic Metabolic Panel: Recent Labs  Lab 03/18/20 1238  NA 138  K 3.8  CL 103  CO2 25  GLUCOSE 98  BUN 16  CREATININE 0.99  CALCIUM 9.4    Liver Function Tests: Recent Labs  Lab 03/18/20 1238  AST  20  ALT 18  ALKPHOS 66  BILITOT 0.9  PROT 8.2*  ALBUMIN 4.8   No results for input(s): LIPASE, AMYLASE in the last 168 hours. No results for input(s): AMMONIA in the last 168 hours.  CBC: Recent Labs  Lab 03/18/20 1238  WBC 6.0  NEUTROABS 3.5  HGB 15.8  HCT 46.2  MCV 86.4  PLT 266    Cardiac Enzymes: No results for input(s): CKTOTAL, CKMB, CKMBINDEX, TROPONINI in the last 168 hours.  BNP: Invalid input(s): POCBNP  CBG: Recent Labs  Lab 03/18/20 1238  GLUCAP 91    Microbiology: Results for orders placed or  performed during the hospital encounter of 10/04/19  C Difficile Quick Screen w PCR reflex     Status: None   Collection Time: 10/04/19  3:15 PM   Specimen: STOOL  Result Value Ref Range Status   C Diff antigen NEGATIVE NEGATIVE Final   C Diff toxin NEGATIVE NEGATIVE Final   C Diff interpretation No C. difficile detected.  Final    Comment: Performed at [facility], [address] [place], [place] [address]  Gastrointestinal Panel by PCR , Stool     Status: None   Collection Time: 10/04/19  3:15 PM   Specimen: STOOL  Result Value Ref Range Status   Campylobacter species NOT DETECTED NOT DETECTED Final   Plesimonas shigelloides NOT DETECTED NOT DETECTED Final   Salmonella species NOT DETECTED NOT DETECTED Final   Yersinia enterocolitica NOT DETECTED NOT DETECTED Final   Vibrio species NOT DETECTED NOT DETECTED Final   Vibrio cholerae NOT DETECTED NOT DETECTED Final   Enteroaggregative E coli (EAEC) NOT DETECTED NOT DETECTED Final   Enteropathogenic E coli (EPEC) NOT DETECTED NOT DETECTED Final   Enterotoxigenic E coli (ETEC) NOT DETECTED NOT DETECTED Final   Shiga like toxin producing E coli (STEC) NOT DETECTED NOT DETECTED Final   Shigella/Enteroinvasive E coli (EIEC) NOT DETECTED NOT DETECTED Final   Cryptosporidium NOT DETECTED NOT DETECTED Final   Cyclospora cayetanensis NOT DETECTED NOT DETECTED Final   Entamoeba histolytica NOT DETECTED NOT DETECTED Final   Giardia lamblia NOT DETECTED NOT DETECTED Final   Adenovirus F40/41 NOT DETECTED NOT DETECTED Final   Astrovirus NOT DETECTED NOT DETECTED Final   Norovirus GI/GII NOT DETECTED NOT DETECTED Final   Rotavirus A NOT DETECTED NOT DETECTED Final   Sapovirus (I, II, IV, and V) NOT DETECTED NOT DETECTED Final    Comment: Performed at [facility], [address] [place], [place] [address]    Coagulation Studies: Recent Labs    03/18/20 1238  LABPROT 12.6  INR 1.0    Urinalysis: No results for  input(s): COLORURINE, LABSPEC, PHURINE, GLUCOSEU, HGBUR, BILIRUBINUR, KETONESUR, PROTEINUR, UROBILINOGEN, NITRITE, LEUKOCYTESUR in the last 168 hours.  Invalid input(s): APPERANCEUR  Lipid Panel: No results found for: CHOL, TRIG, HDL, CHOLHDL, VLDL, LDLCALC  HgbA1C: No results found for: HGBA1C  Urine Drug Screen:      Component Value Date/Time   LABOPIA NONE DETECTED 05/14/2018 2059   COCAINSCRNUR NONE DETECTED 05/14/2018 2059   LABBENZ NONE DETECTED 05/14/2018 2059   AMPHETMU NONE DETECTED 05/14/2018 2059   THCU NONE DETECTED 05/14/2018 2059   LABBARB NONE DETECTED 05/14/2018 2059    Alcohol Level: No results for input(s): ETH in the last 168 hours.  Other results: EKG: normal sinus rhythm at 75 bpm.  Imaging: CT Angio Head W or Wo Contrast  Result Date: 03/18/2020 CLINICAL DATA:  Left-sided facial and body  numbness with headache. History of TIA. EXAM: CT ANGIOGRAPHY HEAD AND NECK TECHNIQUE: Multidetector CT imaging of the head and neck was performed using the standard protocol during bolus administration of intravenous contrast. Multiplanar CT image reconstructions and MIPs were obtained to evaluate the vascular anatomy. Carotid stenosis measurements (when applicable) are obtained utilizing NASCET criteria, using the distal internal carotid diameter as the denominator. CONTRAST:  100mL OMNIPAQUE IOHEXOL 350 MG/ML SOLN COMPARISON:  None. FINDINGS: CTA NECK FINDINGS Aortic arch: Standard 3 vessel aortic arch with widely patent arch vessel origins. Right carotid system: Patent and smooth without evidence of stenosis or dissection. Left carotid system: Patent and smooth without evidence of stenosis or dissection. Vertebral arteries: Patent and smooth without evidence of stenosis or dissection. Mildly to moderately dominant left vertebral artery. Skeleton: No acute osseous abnormality or suspicious osseous lesion. Other neck: No evidence of cervical lymphadenopathy or mass. Upper chest:  Clear lung apices. Review of the MIP images confirms the above findings CTA HEAD FINDINGS Anterior circulation: The internal carotid arteries are patent from skull base crash that the internal carotid arteries are widely patent from skull base to carotid termini. ACAs and MCAs are patent with diffusely irregular appearance of the branch vessels felt to be technical in nature and related to small vessel size and image noise. No proximal branch occlusion or flow limiting A1 or M1 stenosis is evident. No aneurysm is identified. Posterior circulation: The intracranial vertebral arteries are widely patent to the basilar. Patent PICA and SCA origins are seen bilaterally. The basilar artery is widely patent. Posterior communicating arteries are diminutive or absent. Both PCAs are patent without evidence of a flow limiting proximal stenosis. No aneurysm is identified. Venous sinuses: Patent. Anatomic variants: None. Review of the MIP images confirms the above findings IMPRESSION: Negative head and neck CTA. Electronically Signed   By: Logan Bores M.D.   On: 03/18/2020 14:56   CT HEAD WO CONTRAST  Result Date: 03/18/2020 CLINICAL DATA:  Neuro deficit, acute, stroke suspected. Additional history provided: Patient reports left-sided facial numbness, visual disturbance, headache, dizziness. EXAM: CT HEAD WITHOUT CONTRAST TECHNIQUE: Contiguous axial images were obtained from the base of the skull through the vertex without intravenous contrast. COMPARISON:  Head CT 07/26/2018 FINDINGS: Brain: Cerebral volume is normal. There is no acute intracranial hemorrhage. No demarcated cortical infarct. No extra-axial fluid collection. No evidence of intracranial mass. No midline shift. Vascular: No hyperdense vessel. Skull: Normal. Negative for fracture or focal lesion. Sinuses/Orbits: Visualized orbits show no acute finding. Moderate bilateral ethmoid sinus mucosal thickening. Mild bilateral sphenoid sinus mucosal thickening. No  significant mastoid effusion. IMPRESSION: Unremarkable non-contrast CT appearance of the brain. No evidence of acute intracranial abnormality. Moderate ethmoid sinus mucosal thickening. Mild sphenoid sinus mucosal thickening. Electronically Signed   By: Kellie Simmering DO   On: 03/18/2020 13:36   CT Angio Neck W and/or Wo Contrast  Result Date: 03/18/2020 CLINICAL DATA:  Left-sided facial and body numbness with headache. History of TIA. EXAM: CT ANGIOGRAPHY HEAD AND NECK TECHNIQUE: Multidetector CT imaging of the head and neck was performed using the standard protocol during bolus administration of intravenous contrast. Multiplanar CT image reconstructions and MIPs were obtained to evaluate the vascular anatomy. Carotid stenosis measurements (when applicable) are obtained utilizing NASCET criteria, using the distal internal carotid diameter as the denominator. CONTRAST:  35mL OMNIPAQUE IOHEXOL 350 MG/ML SOLN COMPARISON:  None. FINDINGS: CTA NECK FINDINGS Aortic arch: Standard 3 vessel aortic arch with widely patent arch vessel origins. Right  carotid system: Patent and smooth without evidence of stenosis or dissection. Left carotid system: Patent and smooth without evidence of stenosis or dissection. Vertebral arteries: Patent and smooth without evidence of stenosis or dissection. Mildly to moderately dominant left vertebral artery. Skeleton: No acute osseous abnormality or suspicious osseous lesion. Other neck: No evidence of cervical lymphadenopathy or mass. Upper chest: Clear lung apices. Review of the MIP images confirms the above findings CTA HEAD FINDINGS Anterior circulation: The internal carotid arteries are patent from skull base crash that the internal carotid arteries are widely patent from skull base to carotid termini. ACAs and MCAs are patent with diffusely irregular appearance of the branch vessels felt to be technical in nature and related to small vessel size and image noise. No proximal branch  occlusion or flow limiting A1 or M1 stenosis is evident. No aneurysm is identified. Posterior circulation: The intracranial vertebral arteries are widely patent to the basilar. Patent PICA and SCA origins are seen bilaterally. The basilar artery is widely patent. Posterior communicating arteries are diminutive or absent. Both PCAs are patent without evidence of a flow limiting proximal stenosis. No aneurysm is identified. Venous sinuses: Patent. Anatomic variants: None. Review of the MIP images confirms the above findings IMPRESSION: Negative head and neck CTA. Electronically Signed   By: Logan Bores M.D.   On: 03/18/2020 14:56    Assessment: 29 y.o. male with a history of seizure (?pseudoseizure), vitamin D and B12 deficiency, migraine presenting with complaints of left sided numbness and weakness with acute onset.  Patient outside time window for tPA.  CTA of head and neck performed and personally reviewed.  CTA normal.  Patient not a candidate for thrombectomy.  Although acute infarct remains in the differential, patient also with a history of seizure/pseudoseizure and vitamin deficiency.  Further work up recommended.    Stroke Risk Factors - none  Plan: 1. UDS, B12, vitamin D 2. MRI of the brain with and without contrast.  If no evidence of acute infarct, would not initiate stroke/TIA work up. 3. PT consult, OT consult, Speech consult 4. ASA 325mg  now with continued ASA 81mg  po daily 5. NPO until RN stroke swallow screen 6. Telemetry monitoring 7. Frequent neuro checks 8. Seizure precautions 9. EEG 10. Patient unable to drive, operate heavy machinery, perform activities at heights and participate in water activities until release by outpatient physician.   Alexis Goodell, MD Neurology  03/18/2020, 3:03 PM

## 2020-03-19 DIAGNOSIS — R569 Unspecified convulsions: Secondary | ICD-10-CM

## 2020-03-19 LAB — TSH: TSH: 2.011 u[IU]/mL (ref 0.350–4.500)

## 2020-03-19 LAB — VITAMIN D 25 HYDROXY (VIT D DEFICIENCY, FRACTURES): Vit D, 25-Hydroxy: 23.16 ng/mL — ABNORMAL LOW (ref 30–100)

## 2020-03-19 LAB — VITAMIN B12: Vitamin B-12: 249 pg/mL (ref 180–914)

## 2020-03-19 LAB — HIV ANTIBODY (ROUTINE TESTING W REFLEX): HIV Screen 4th Generation wRfx: NONREACTIVE

## 2020-03-19 MED ORDER — VITAMIN B-12 1000 MCG PO TABS: 1000.0000 ug | ORAL_TABLET | Freq: Every day | ORAL | 2 refills | Status: AC

## 2020-03-19 MED ORDER — ASPIRIN 81 MG PO TBEC
81.0000 mg | DELAYED_RELEASE_TABLET | Freq: Every day | ORAL | 2 refills | Status: DC
Start: 2020-03-20 — End: 2020-05-15

## 2020-03-19 NOTE — Procedures (Signed)
ELECTROENCEPHALOGRAM REPORT   Patient: Tony [name]       Room #: ED EEG No. ID: [id number] Age: 29 y.o.        Sex: male Requesting Physician: Bonner Puna Report Date:  03/19/2020        Interpreting Physician: Alexis Goodell  History: Tony [name] is an 29 y.o. male with history of seizures presenting with left sided weakness  Medications:  ASA  Conditions of Recording:  This is a 21 channel routine scalp EEG performed with bipolar and monopolar montages arranged in accordance to the international 10/20 system of electrode placement. One channel was dedicated to EKG recording.  The patient is in the awake, drowsy and asleep states.  Description:  The waking background activity consists of a low voltage, symmetrical, fairly well organized, 9 Hz alpha activity, seen from the parieto-occipital and posterior temporal regions.  Low voltage fast activity, poorly organized, is seen anteriorly and is at times superimposed on more posterior regions.  A mixture of theta and alpha rhythms are seen from the central and temporal regions. The patient drowses with slowing to irregular, low voltage theta and beta activity.   The patient goes in to a light sleep briefly with symmetrical sleep spindles, vertex central sharp transients and irregular slow activity.   No epileptiform activity is noted.   Hyperventilation was not performed.  Intermittent photic stimulation was performed but failed to illicit any change in the tracing.     IMPRESSION: Normal electroencephalogram, awake, asleep and with activation procedures. There are no focal lateralizing or epileptiform features.   Alexis Goodell, MD Neurology  03/19/2020, 8:25 AM

## 2020-03-19 NOTE — Progress Notes (Signed)
Subjective: Reports that he still feels some weak on the left and still has the numbness although improved.  No new neurological complaints.    Objective: Current vital signs: BP 113/74   Pulse 77   Temp 98.6 F (37 C) (Oral)   Resp 16   Ht 5\' 10"  (1.778 m)   Wt 90.7 kg   SpO2 96%   BMI 28.70 kg/m  Vital signs in last 24 hours: Temp:  [98.6 F (37 C)] 98.6 F (37 C) (10/13 1231) Pulse Rate:  [59-79] 77 (10/14 0737) Resp:  [14-18] 16 (10/14 0600) BP: (108-130)/(73-86) 113/74 (10/14 0737) SpO2:  [94 %-98 %] 96 % (10/14 0741) Weight:  [90.7 kg] 90.7 kg (10/13 1231)  Intake/Output from previous day: No intake/output data recorded. Intake/Output this shift: No intake/output data recorded. Nutritional status:  Diet Order            Diet Heart Room service appropriate? Yes; Fluid consistency: Thin  Diet effective now                 Neurologic Exam: Mental Status: Alert, oriented, thought content appropriate.  Speech fluent without evidence of aphasia.  Able to follow 3 step commands without difficulty. Cranial Nerves: II: Visual fields grossly normal, pupils equal, round, reactive to light and accommodation III,IV, VI: ptosis not present, extra-ocular motions intact bilaterally V,VII: right facial droop, facial light touch sensation decreased on the left VIII: hearing normal bilaterally IX,X: gag reflex present XI: bilateral shoulder shrug XII: midline tongue extension Motor: Right :  Upper extremity   5/5                                      Left:     Upper extremity   5/5             Lower extremity   5/5                                                  Lower extremity   5-/5 Give-way weakness in both upper extremities on testing.  Tone and bulk:normal tone throughout; no atrophy noted Sensory: Pinprick and light touch decreased in the left upper and lower extremities  Lab Results: Basic Metabolic Panel: Recent Labs  Lab 03/18/20 1238  NA 138  K 3.8  CL 103   CO2 25  GLUCOSE 98  BUN 16  CREATININE 0.99  CALCIUM 9.4    Liver Function Tests: Recent Labs  Lab 03/18/20 1238  AST 20  ALT 18  ALKPHOS 66  BILITOT 0.9  PROT 8.2*  ALBUMIN 4.8   No results for input(s): LIPASE, AMYLASE in the last 168 hours. No results for input(s): AMMONIA in the last 168 hours.  CBC: Recent Labs  Lab 03/18/20 1238  WBC 6.0  NEUTROABS 3.5  HGB 15.8  HCT 46.2  MCV 86.4  PLT 266    Cardiac Enzymes: No results for input(s): CKTOTAL, CKMB, CKMBINDEX, TROPONINI in the last 168 hours.  Lipid Panel: No results for input(s): CHOL, TRIG, HDL, CHOLHDL, VLDL, LDLCALC in the last 168 hours.  CBG: Recent Labs  Lab 03/18/20 1238  GLUCAP 91    Microbiology: Results for orders placed or performed during the hospital encounter of 03/18/20  Respiratory Panel  by RT PCR (Flu A&B, Covid) - Nasopharyngeal Swab     Status: None   Collection Time: 03/18/20  2:57 PM   Specimen: Nasopharyngeal Swab  Result Value Ref Range Status   SARS Coronavirus 2 by RT PCR NEGATIVE NEGATIVE Final    Comment: (NOTE) SARS-CoV-2 target nucleic acids are NOT DETECTED.  The SARS-CoV-2 RNA is generally detectable in upper respiratoy specimens during the acute phase of infection. The lowest concentration of SARS-CoV-2 viral copies this assay can detect is 131 copies/mL. A negative result does not preclude SARS-Cov-2 infection and should not be used as the sole basis for treatment or other patient management decisions. A negative result may occur with  improper specimen collection/handling, submission of specimen other than nasopharyngeal swab, presence of viral mutation(s) within the areas targeted by this assay, and inadequate number of viral copies (<131 copies/mL). A negative result must be combined with clinical observations, patient history, and epidemiological information. The expected result is Negative.  Fact Sheet for Patients:   PinkCheek.be  Fact Sheet for Healthcare Providers:  GravelBags.it  This test is no t yet approved or cleared by the [place] FDA and  has been authorized for detection and/or diagnosis of SARS-CoV-2 by FDA under an Emergency Use Authorization (EUA). This EUA will remain  in effect (meaning this test can be used) for the duration of the COVID-19 declaration under Section 564(b)(1) of the Act, 21 U.S.C. section 360bbb-3(b)(1), unless the authorization is terminated or revoked sooner.     Influenza A by PCR NEGATIVE NEGATIVE Final   Influenza B by PCR NEGATIVE NEGATIVE Final    Comment: (NOTE) The Xpert Xpress SARS-CoV-2/FLU/RSV assay is intended as an aid in  the diagnosis of influenza from Nasopharyngeal swab specimens and  should not be used as a sole basis for treatment. Nasal washings and  aspirates are unacceptable for Xpert Xpress SARS-CoV-2/FLU/RSV  testing.  Fact Sheet for Patients: PinkCheek.be  Fact Sheet for Healthcare Providers: GravelBags.it  This test is not yet approved or cleared by the [place] FDA and  has been authorized for detection and/or diagnosis of SARS-CoV-2 by  FDA under an Emergency Use Authorization (EUA). This EUA will remain  in effect (meaning this test can be used) for the duration of the  Covid-19 declaration under Section 564(b)(1) of the Act, 21  U.S.C. section 360bbb-3(b)(1), unless the authorization is  terminated or revoked. Performed at [facility], [address], [place],  [address]     Coagulation Studies: Recent Labs    03/18/20 1238  LABPROT 12.6  INR 1.0    Imaging: EEG  Result Date: 03/19/2020 Alexis Goodell, MD     03/19/2020  8:27 AM ELECTROENCEPHALOGRAM REPORT Patient: Cleotis Lema       Room #: ED EEG No. ID: [id number] Age: 29 y.o.        Sex: male Requesting Physician:  Bonner Puna Report Date:  03/19/2020       Interpreting Physician: Alexis Goodell History: Osceola Depaz is an 29 y.o. male with history of seizures presenting with left sided weakness Medications: ASA Conditions of Recording:  This is a 21 channel routine scalp EEG performed with bipolar and monopolar montages arranged in accordance to the international 10/20 system of electrode placement. One channel was dedicated to EKG recording. The patient is in the awake, drowsy and asleep states. Description:  The waking background activity consists of a low voltage, symmetrical, fairly well organized, 9 Hz alpha activity, seen from the parieto-occipital and  posterior temporal regions.  Low voltage fast activity, poorly organized, is seen anteriorly and is at times superimposed on more posterior regions.  A mixture of theta and alpha rhythms are seen from the central and temporal regions. The patient drowses with slowing to irregular, low voltage theta and beta activity.  The patient goes in to a light sleep briefly with symmetrical sleep spindles, vertex central sharp transients and irregular slow activity.  No epileptiform activity is noted.  Hyperventilation was not performed.  Intermittent photic stimulation was performed but failed to illicit any change in the tracing.  IMPRESSION: Normal electroencephalogram, awake, asleep and with activation procedures. There are no focal lateralizing or epileptiform features. Alexis Goodell, MD Neurology 03/19/2020, 8:25 AM   CT Angio Head W or Wo Contrast  Result Date: 03/18/2020 CLINICAL DATA:  Left-sided facial and body numbness with headache. History of TIA. EXAM: CT ANGIOGRAPHY HEAD AND NECK TECHNIQUE: Multidetector CT imaging of the head and neck was performed using the standard protocol during bolus administration of intravenous contrast. Multiplanar CT image reconstructions and MIPs were obtained to evaluate the vascular anatomy. Carotid stenosis measurements (when  applicable) are obtained utilizing NASCET criteria, using the distal internal carotid diameter as the denominator. CONTRAST:  83mL OMNIPAQUE IOHEXOL 350 MG/ML SOLN COMPARISON:  None. FINDINGS: CTA NECK FINDINGS Aortic arch: Standard 3 vessel aortic arch with widely patent arch vessel origins. Right carotid system: Patent and smooth without evidence of stenosis or dissection. Left carotid system: Patent and smooth without evidence of stenosis or dissection. Vertebral arteries: Patent and smooth without evidence of stenosis or dissection. Mildly to moderately dominant left vertebral artery. Skeleton: No acute osseous abnormality or suspicious osseous lesion. Other neck: No evidence of cervical lymphadenopathy or mass. Upper chest: Clear lung apices. Review of the MIP images confirms the above findings CTA HEAD FINDINGS Anterior circulation: The internal carotid arteries are patent from skull base crash that the internal carotid arteries are widely patent from skull base to carotid termini. ACAs and MCAs are patent with diffusely irregular appearance of the branch vessels felt to be technical in nature and related to small vessel size and image noise. No proximal branch occlusion or flow limiting A1 or M1 stenosis is evident. No aneurysm is identified. Posterior circulation: The intracranial vertebral arteries are widely patent to the basilar. Patent PICA and SCA origins are seen bilaterally. The basilar artery is widely patent. Posterior communicating arteries are diminutive or absent. Both PCAs are patent without evidence of a flow limiting proximal stenosis. No aneurysm is identified. Venous sinuses: Patent. Anatomic variants: None. Review of the MIP images confirms the above findings IMPRESSION: Negative head and neck CTA. Electronically Signed   By: Logan Bores M.D.   On: 03/18/2020 14:56   CT HEAD WO CONTRAST  Result Date: 03/18/2020 CLINICAL DATA:  Neuro deficit, acute, stroke suspected. Additional history  provided: Patient reports left-sided facial numbness, visual disturbance, headache, dizziness. EXAM: CT HEAD WITHOUT CONTRAST TECHNIQUE: Contiguous axial images were obtained from the base of the skull through the vertex without intravenous contrast. COMPARISON:  Head CT 07/26/2018 FINDINGS: Brain: Cerebral volume is normal. There is no acute intracranial hemorrhage. No demarcated cortical infarct. No extra-axial fluid collection. No evidence of intracranial mass. No midline shift. Vascular: No hyperdense vessel. Skull: Normal. Negative for fracture or focal lesion. Sinuses/Orbits: Visualized orbits show no acute finding. Moderate bilateral ethmoid sinus mucosal thickening. Mild bilateral sphenoid sinus mucosal thickening. No significant mastoid effusion. IMPRESSION: Unremarkable non-contrast CT appearance of the brain.  No evidence of acute intracranial abnormality. Moderate ethmoid sinus mucosal thickening. Mild sphenoid sinus mucosal thickening. Electronically Signed   By: Kellie Simmering DO   On: 03/18/2020 13:36   CT Angio Neck W and/or Wo Contrast  Result Date: 03/18/2020 CLINICAL DATA:  Left-sided facial and body numbness with headache. History of TIA. EXAM: CT ANGIOGRAPHY HEAD AND NECK TECHNIQUE: Multidetector CT imaging of the head and neck was performed using the standard protocol during bolus administration of intravenous contrast. Multiplanar CT image reconstructions and MIPs were obtained to evaluate the vascular anatomy. Carotid stenosis measurements (when applicable) are obtained utilizing NASCET criteria, using the distal internal carotid diameter as the denominator. CONTRAST:  55mL OMNIPAQUE IOHEXOL 350 MG/ML SOLN COMPARISON:  None. FINDINGS: CTA NECK FINDINGS Aortic arch: Standard 3 vessel aortic arch with widely patent arch vessel origins. Right carotid system: Patent and smooth without evidence of stenosis or dissection. Left carotid system: Patent and smooth without evidence of stenosis or  dissection. Vertebral arteries: Patent and smooth without evidence of stenosis or dissection. Mildly to moderately dominant left vertebral artery. Skeleton: No acute osseous abnormality or suspicious osseous lesion. Other neck: No evidence of cervical lymphadenopathy or mass. Upper chest: Clear lung apices. Review of the MIP images confirms the above findings CTA HEAD FINDINGS Anterior circulation: The internal carotid arteries are patent from skull base crash that the internal carotid arteries are widely patent from skull base to carotid termini. ACAs and MCAs are patent with diffusely irregular appearance of the branch vessels felt to be technical in nature and related to small vessel size and image noise. No proximal branch occlusion or flow limiting A1 or M1 stenosis is evident. No aneurysm is identified. Posterior circulation: The intracranial vertebral arteries are widely patent to the basilar. Patent PICA and SCA origins are seen bilaterally. The basilar artery is widely patent. Posterior communicating arteries are diminutive or absent. Both PCAs are patent without evidence of a flow limiting proximal stenosis. No aneurysm is identified. Venous sinuses: Patent. Anatomic variants: None. Review of the MIP images confirms the above findings IMPRESSION: Negative head and neck CTA. Electronically Signed   By: Logan Bores M.D.   On: 03/18/2020 14:56   MR BRAIN W WO CONTRAST  Result Date: 03/18/2020 CLINICAL DATA:  29 year old male presenting with neurologic deficit, headache, dizziness, visual disturbance, and left face numbness. EXAM: MRI HEAD WITHOUT AND WITH CONTRAST TECHNIQUE: Multiplanar, multiecho pulse sequences of the brain and surrounding structures were obtained without and with intravenous contrast. CONTRAST:  48mL GADAVIST GADOBUTROL 1 MMOL/ML IV SOLN COMPARISON:  Head CT and CTA head and neck earlier today. FINDINGS: Brain: Normal cerebral volume. No restricted diffusion to suggest acute  infarction. No midline shift, mass effect, evidence of mass lesion, ventriculomegaly, extra-axial collection or acute intracranial hemorrhage. Cervicomedullary junction and pituitary are within normal limits. Normal gray and white matter signal throughout the brain. No encephalomalacia or chronic cerebral blood products identified. Thin slice coronal imaging of the temporal lobes also provided. Hippocampal formations appear symmetric and within normal limits. No mesial temporal lobe signal abnormality identified. No migrational abnormality identified. No abnormal enhancement identified. Medial right cerebellum developmental venous anomaly (normal variant). No dural thickening. Normal cavernous sinus. Vascular: Major intracranial vascular flow voids are preserved. The major dural venous sinuses are enhancing and appear to be patent. Skull and upper cervical spine: Negative. Sinuses/Orbits: Negative orbits. Bilateral paranasal sinus mucosal thickening and/or retention cysts. Small fluid level in the right sphenoid sinus. Symmetric nasal cavity mucosal thickening.  Other: Trace mastoid air cell fluid. Negative visible nasopharynx. Visible internal auditory structures appear normal. Scalp and face appear negative. IMPRESSION: 1. Normal MRI appearance of the brain. 2. Paranasal sinus inflammation, rhinosinusitis. Electronically Signed   By: Genevie Ann M.D.   On: 03/18/2020 18:04    Medications:  I have reviewed the patient's current medications. Scheduled: . aspirin EC  81 mg Oral Daily  . enoxaparin (LOVENOX) injection  40 mg Subcutaneous Q24H  . sodium chloride flush  3 mL Intravenous Q12H    Assessment/Plan: 29 y.o. male with a history of seizure (?pseudoseizure), vitamin D and B12 deficiency, migraine presenting with complaints of left sided numbness and weakness with acute onset.  Patient outside time window for tPA.  CTA of head and neck performed and personally reviewed.  CTA normal.  Patient not a  candidate for thrombectomy.  Although acute infarct remains in the differential, patient also with a history of seizure/pseudoseizure and vitamin deficiency.   MRI of the head personally reviewed and is normal.  EEG unremarkable.   B12 low normal at 249.  Vitamin D pending.  UDS pending.  Unclear etiology of presentation but may be secondary to vitamin deficiencies.    Recommendations: 1. Continued B12 supplementation 2. Vitamin D level to be followed up and supplemented if necessary 3. Continue ASA 81mg  daily 4. Patient to continue follow up with Dr Manuella Ghazi at [facility] 5. Patient unable to drive, operate heavy machinery, perform activities at heights and participate in water activities until release by outpatient physician.    LOS: 0 days   Alexis Goodell, MD Neurology  03/19/2020  9:56 AM

## 2020-03-19 NOTE — Evaluation (Addendum)
Physical Therapy Evaluation Patient Details Name: Tony [name] MRN: [id number] DOB: [birth year] Today's Date: 03/19/2020   History of Present Illness  29 y.o. male with a history of seizure-like activity (seizure disorder vs. nonepileptic) initially 2017, concussions, tubular adenoma who presented to the ED 10/13 with multiple complaints consistent with prior episodes that could possibly be due to seizures vs. migraines vs. pseudoseizures. These symptoms began on his drive to work at 8am consisting of lightheadedness, mild headache bilaterally that didn't remit once he got to work. Gradually began noticing abnormal sensation on the left side of the body (face, torso, arm, leg, foot) that remained constant through lunch when he decided to present to the ED.  At time of PT exam symptoms have improved but L side remains somewhat numb, weaker (LE>UE)   Clinical Impression  Pt reports that symptoms have improved but that he is still having some numbness on L side and with testing though his strength is functional there were some asymmetries (LE > UE) with L weakness.  Pt had good quality of motion and coordination b/l.  Pt able to easily ambulate at community appropriate speed, no issues with head turns and directional changes. Very mild change in QofM on L with cadence, but again fully functional.  Pt reports he has had multiple concussions in the past and similar but more transient issues like this.  Minimal blurry vision that similarly is not functionally limiting.  Pt safe to return home from PT stand point, discussed that easing back into heavier work along with trying to ease his excessive work load will be necessary when he discharges; also discussed about being aware if symptoms worsen and to f/u with MD (and possible future outpt PT if this is the case).  Will complete PT orders no f/u needs at this time. CT and MRI negative for CVA.    Follow Up Recommendations No PT follow up (did discuss being  aware of worsening symptoms and to f/u PCP)    Equipment Recommendations  None recommended by PT    Recommendations for Other Services       Precautions / Restrictions Precautions Precautions: None Restrictions Weight Bearing Restrictions: No      Mobility  Bed Mobility Overal bed mobility: Independent                Transfers Overall transfer level: Independent                  Ambulation/Gait Ambulation/Gait assistance: Independent Gait Distance (Feet): 300 Feet Assistive device: None       General Gait Details: Pt with no foot drop or unsteadiness, but did appear to have less consistent quality of movement on the L during ambulation.   Stairs            Wheelchair Mobility    Modified Rankin (Stroke Patients Only)       Balance Overall balance assessment: Independent (able to tolerate SLS, pertubations with EC NBOS, heel raises)                                           Pertinent Vitals/Pain Pain Assessment: No/denies pain    Home Living Family/patient expects to be discharged to:: Private residence Living Arrangements: Spouse/significant other;Children Available Help at Discharge: Family Type of Home: House Home Access: Level entry     Home Layout: One level Home Equipment: None  Prior Function Level of Independence: Independent         Comments: 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, 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, moving appliances, etc     Hand Dominance        Extremity/Trunk Assessment   Upper Extremity Assessment Upper Extremity Assessment: Overall WFL for tasks assessed;LUE deficits/detail (L UE functional, though slightly weaker than R) LUE Sensation: decreased light touch;decreased proprioception LUE Coordination: WNL    Lower Extremity Assessment Lower Extremity Assessment: Overall WFL for tasks assessed (L strong, weaker than R (more limited vs UEs respectively))        Communication   Communication: No difficulties  Cognition Arousal/Alertness: Awake/alert Behavior During Therapy: WFL for tasks assessed/performed Overall Cognitive Status: Within Functional Limits for tasks assessed                                        General Comments      Exercises     Assessment/Plan    PT Assessment Patent does not need any further PT services  PT Problem List         PT Treatment Interventions      PT Goals (Current goals can be found in the Care Plan section)  Acute Rehab PT Goals [name] PT Goal Formulation: All assessment and education complete, DC therapy    Frequency     Barriers to discharge        Co-evaluation               AM-PAC PT "6 Clicks" Mobility  Outcome Measure Help needed turning from your back to your side while in a flat bed without using bedrails?: None Help needed moving from lying on your back to sitting on the side of a flat bed without using bedrails?: None Help needed moving to and from a bed to a chair (including a wheelchair)?: None Help needed standing up from a chair using your arms (e.g., wheelchair or bedside chair)?: None Help needed to walk in hospital room?: None Help needed climbing 3-5 steps with a railing? : None 6 Click Score: 24    End of Session Equipment Utilized During Treatment: Gait belt Activity Tolerance: Patient tolerated treatment well (vitals normal t/o the session) Patient left: with call bell/phone within reach;in bed   PT Visit Diagnosis: Muscle weakness (generalized) (M62.81);Other symptoms and signs involving the nervous system (G25.638)    Time: 9373-4287 PT Time Calculation (min) (ACUTE ONLY): 21 min   Charges:   PT Evaluation $PT Eval Low Complexity: 1 Low          Kreg Shropshire, DPT 03/19/2020, 10:23 AM

## 2020-03-19 NOTE — Progress Notes (Signed)
OT Screen Note  Patient Details Name: Tony [name] MRN: [id number] DOB: [birth year]   Cancelled Treatment:    Reason Eval/Treat Not Completed: OT screened, no needs identified, will sign off. Consult received, chart reviewed. Pt pleasant, A&Ox4. Endorses very mild residual sensory/strength deficits in LUE ("95% better") but not significant enough to impact functional performance with ADL/IADL. Pt briefly educated in safety strategies 2/2 impaired sensation. Pt indep with mobility and ADL tasks. No functional deficits appreciated. No skilled OT needs identified. Will sign off. Please re-consult if additional acute OT needs arise.   Jeni Salles, MPH, MS, OTR/L ascom [phone] 03/19/20, 11:37 AM

## 2020-03-19 NOTE — Progress Notes (Signed)
Attempted to call report to floor, unable to report at this time.

## 2020-03-19 NOTE — Discharge Summary (Signed)
Physician Discharge Summary  Tony [name] YQI:[id number] DOB: 07-Oct-1990 DOA: 03/18/2020  PCP: Glean Hess, MD  Admit date: 03/18/2020 Discharge date: 03/19/2020  Admitted From: Home Disposition: Home   Recommendations for Outpatient Follow-up:  1. Follow up with PCP in 1-2 weeks 2. Follow up with neurology, Dr. Manuella Ghazi, after discharge for continued management. 3. Please follow up on the following pending results: Vitamin D level 4. Continue vitamin B12 supplementation  Home Health: None Equipment/Devices: None Discharge Condition: Stable CODE STATUS: Full Diet recommendation: Regular  Brief/Interim Summary: Tony [name] is a 29 y.o. male with a history of seizure-like activity (seizure disorder vs. nonepileptic) initially 2017, concussions, tubular adenoma who presented to the ED 10/13 with multiple complaints consistent with prior episodes that could possibly be due to seizures vs. migraines vs. pseudoseizures. These symptoms began on his drive to work at 8am consisting of lightheadedness, mild headache bilaterally that didn't remit once he got to work. Gradually began noticing abnormal sensation on the left side of the body (face, torso, arm, leg, foot) that remained constant through lunch when he decided to present to the ED when symptoms improved but have not yet resolved. Of note he is from [place], moved to [place], [place] a year ago, has a 30 year old and 9 month old with his wife, works more overtime lately and sleeps about 4 hours per night, is under a lot of stress.  ED Course: VSS, labs unremarkable, CT head negative, no LVO on CTA head and neck. Neurology consulted, recommending MRI, EEG, telemetry, and observation.   MRI and EEG were negative and symptoms have significantly improved since admission. PT recommends no further follow up and neurology recommends discharge without outpatient neurology follow up, restrictions on driving, etc. and continue aspirin 81mg .    Discharge Diagnoses:  Active Problems:   Seizure-like activity ([place])   Acute left-sided weakness No seizures identified on EEG, no stroke on neuroimaging. B12 low normal at 249.  Vitamin D pending.  UDS pending.  Unclear etiology of presentation but may be secondary to vitamin deficiencies.   1. Continued B12 supplementation 2. Vitamin D level to be followed up and supplemented if necessary 3. Continue ASA 81mg  daily 4. Patient to continue follow up with Dr [facility]uella Ghazi at [facility] 5. Patient unable to drive, operate heavy machinery, perform activities at heights and participate in water activities until release by outpatient physician.  Discharge Instructions Discharge Instructions    Discharge instructions   Complete by: As directed    1. Continue B12 supplementation (new prescription sent [facility] your [facility]), and vitamin D supplementation (available OTC) 3. Continue Aspirin 81mg  daily (prescription sent to your [facility]) 4. Continue follow up with Dr Manuella Ghazi at [facility] 5. Patient unable to drive, operate heavy machinery, perform activities at heights and participate in water activities until release by outpatient physician. 6. If your symptoms return or worsen, seek medical attention right away. Otherwise, follow up with your PCP in the next 1-2 weeks.     Allergies as of 03/19/2020   No Known Allergies     Medication List    TAKE these medications   acetaminophen 500 MG tablet Commonly known as: TYLENOL Take 1,000 mg by mouth every 6 (six) hours as needed (for pain.).   aspirin 81 MG EC tablet Take 1 tablet (81 mg total) by mouth daily. Start taking on: March 20, 2020   cyanocobalamin 1000 MCG/ML injection Commonly known as: (VITAMIN B-12) inject 49mL IM once a week for four  weeks then once a month for four months.   vitamin B-12 1000 MCG tablet Commonly known as: CYANOCOBALAMIN Take 1 tablet (1,000 mcg total) by mouth daily.   ibuprofen 600 MG tablet Commonly known as:  ADVIL Take 1 tablet (600 mg total) by mouth every 8 (eight) hours as needed for mild pain or moderate pain.   topiramate 25 MG tablet Commonly known as: TOPAMAX Take 50 mg by mouth at bedtime.   Vitamin D3 50 MCG (2000 UT) Tabs Take 2,000 Units by mouth at bedtime.       Follow-up Information    Glean Hess, MD. Schedule an appointment as soon as possible for a visit.   Specialty: Internal Medicine Contact information: [address] [place] [address] [phone]        Vladimir Crofts, MD. Schedule an appointment as soon as possible for a visit.   Specialty: Neurology Contact information: [facility] [place] [address] [phone]              No Known Allergies  Consultations:  Neurology  Procedures/Studies: EEG  Result Date: 03/19/2020 Alexis Goodell, MD     03/19/2020  8:27 AM ELECTROENCEPHALOGRAM REPORT Patient: Tony [name]       Room #: ED EEG No. ID: [id number] Age: 29 y.o.        Sex: male Requesting Physician: Bonner Puna Report Date:  03/19/2020       Interpreting Physician: Alexis Goodell History: Tony [name] is an 29 y.o. male with history of seizures presenting with left sided weakness Medications: ASA Conditions of Recording:  This is a 21 channel routine scalp EEG performed with bipolar and monopolar montages arranged in accordance to the international 10/20 system of electrode placement. One channel was dedicated to EKG recording. The patient is in the awake, drowsy and asleep states. Description:  The waking background activity consists of a low voltage, symmetrical, fairly well organized, 9 Hz alpha activity, seen from the parieto-occipital and posterior temporal regions.  Low voltage fast activity, poorly organized, is seen anteriorly and is at times superimposed on more posterior regions.  A mixture of theta and alpha rhythms are seen from the central and temporal regions. The patient  drowses with slowing to irregular, low voltage theta and beta activity.  The patient goes in to a light sleep briefly with symmetrical sleep spindles, vertex central sharp transients and irregular slow activity.  No epileptiform activity is noted.  Hyperventilation was not performed.  Intermittent photic stimulation was performed but failed to illicit any change in the tracing.  IMPRESSION: Normal electroencephalogram, awake, asleep and with activation procedures. There are no focal lateralizing or epileptiform features. Alexis Goodell, MD Neurology 03/19/2020, 8:25 AM   CT Angio Head W or Wo Contrast  Result Date: 03/18/2020 CLINICAL DATA:  Left-sided facial and body numbness with headache. History of TIA. EXAM: CT ANGIOGRAPHY HEAD AND NECK TECHNIQUE: Multidetector CT imaging of the head and neck was performed using the standard protocol during bolus administration of intravenous contrast. Multiplanar CT image reconstructions and MIPs were obtained to evaluate the vascular anatomy. Carotid stenosis measurements (when applicable) are obtained utilizing NASCET criteria, using the distal internal carotid diameter as the denominator. CONTRAST:  61mL OMNIPAQUE IOHEXOL 350 MG/ML SOLN COMPARISON:  None. FINDINGS: CTA NECK FINDINGS Aortic arch: Standard 3 vessel aortic arch with widely patent arch vessel origins. Right carotid system: Patent and smooth without evidence of stenosis or dissection. Left carotid system: Patent and  smooth without evidence of stenosis or dissection. Vertebral arteries: Patent and smooth without evidence of stenosis or dissection. Mildly to moderately dominant left vertebral artery. Skeleton: No acute osseous abnormality or suspicious osseous lesion. Other neck: No evidence of cervical lymphadenopathy or mass. Upper chest: Clear lung apices. Review of the MIP images confirms the above findings CTA HEAD FINDINGS Anterior circulation: The internal carotid arteries are patent from skull base  crash that the internal carotid arteries are widely patent from skull base to carotid termini. ACAs and MCAs are patent with diffusely irregular appearance of the branch vessels felt to be technical in nature and related to small vessel size and image noise. No proximal branch occlusion or flow limiting A1 or M1 stenosis is evident. No aneurysm is identified. Posterior circulation: The intracranial vertebral arteries are widely patent to the basilar. Patent PICA and SCA origins are seen bilaterally. The basilar artery is widely patent. Posterior communicating arteries are diminutive or absent. Both PCAs are patent without evidence of a flow limiting proximal stenosis. No aneurysm is identified. Venous sinuses: Patent. Anatomic variants: None. Review of the MIP images confirms the above findings IMPRESSION: Negative head and neck CTA. Electronically Signed   By: Logan Bores M.D.   On: 03/18/2020 14:56   CT HEAD WO CONTRAST  Result Date: 03/18/2020 CLINICAL DATA:  Neuro deficit, acute, stroke suspected. Additional history provided: Patient reports left-sided facial numbness, visual disturbance, headache, dizziness. EXAM: CT HEAD WITHOUT CONTRAST TECHNIQUE: Contiguous axial images were obtained from the base of the skull through the vertex without intravenous contrast. COMPARISON:  Head CT 07/26/2018 FINDINGS: Brain: Cerebral volume is normal. There is no acute intracranial hemorrhage. No demarcated cortical infarct. No extra-axial fluid collection. No evidence of intracranial mass. No midline shift. Vascular: No hyperdense vessel. Skull: Normal. Negative for fracture or focal lesion. Sinuses/Orbits: Visualized orbits show no acute finding. Moderate bilateral ethmoid sinus mucosal thickening. Mild bilateral sphenoid sinus mucosal thickening. No significant mastoid effusion. IMPRESSION: Unremarkable non-contrast CT appearance of the brain. No evidence of acute intracranial abnormality. Moderate ethmoid sinus  mucosal thickening. Mild sphenoid sinus mucosal thickening. Electronically Signed   By: Kellie Simmering DO   On: 03/18/2020 13:36   CT Angio Neck W and/or Wo Contrast  Result Date: 03/18/2020 CLINICAL DATA:  Left-sided facial and body numbness with headache. History of TIA. EXAM: CT ANGIOGRAPHY HEAD AND NECK TECHNIQUE: Multidetector CT imaging of the head and neck was performed using the standard protocol during bolus administration of intravenous contrast. Multiplanar CT image reconstructions and MIPs were obtained to evaluate the vascular anatomy. Carotid stenosis measurements (when applicable) are obtained utilizing NASCET criteria, using the distal internal carotid diameter as the denominator. CONTRAST:  50mL OMNIPAQUE IOHEXOL 350 MG/ML SOLN COMPARISON:  None. FINDINGS: CTA NECK FINDINGS Aortic arch: Standard 3 vessel aortic arch with widely patent arch vessel origins. Right carotid system: Patent and smooth without evidence of stenosis or dissection. Left carotid system: Patent and smooth without evidence of stenosis or dissection. Vertebral arteries: Patent and smooth without evidence of stenosis or dissection. Mildly to moderately dominant left vertebral artery. Skeleton: No acute osseous abnormality or suspicious osseous lesion. Other neck: No evidence of cervical lymphadenopathy or mass. Upper chest: Clear lung apices. Review of the MIP images confirms the above findings CTA HEAD FINDINGS Anterior circulation: The internal carotid arteries are patent from skull base crash that the internal carotid arteries are widely patent from skull base to carotid termini. ACAs and MCAs are patent with diffusely irregular  appearance of the branch vessels felt to be technical in nature and related to small vessel size and image noise. No proximal branch occlusion or flow limiting A1 or M1 stenosis is evident. No aneurysm is identified. Posterior circulation: The intracranial vertebral arteries are widely patent to the  basilar. Patent PICA and SCA origins are seen bilaterally. The basilar artery is widely patent. Posterior communicating arteries are diminutive or absent. Both PCAs are patent without evidence of a flow limiting proximal stenosis. No aneurysm is identified. Venous sinuses: Patent. Anatomic variants: None. Review of the MIP images confirms the above findings IMPRESSION: Negative head and neck CTA. Electronically Signed   By: Logan Bores M.D.   On: 03/18/2020 14:56   MR BRAIN W WO CONTRAST  Result Date: 03/18/2020 CLINICAL DATA:  29 year old male presenting with neurologic deficit, headache, dizziness, visual disturbance, and left face numbness. EXAM: MRI HEAD WITHOUT AND WITH CONTRAST TECHNIQUE: Multiplanar, multiecho pulse sequences of the brain and surrounding structures were obtained without and with intravenous contrast. CONTRAST:  16mL GADAVIST GADOBUTROL 1 MMOL/ML IV SOLN COMPARISON:  Head CT and CTA head and neck earlier today. FINDINGS: Brain: Normal cerebral volume. No restricted diffusion to suggest acute infarction. No midline shift, mass effect, evidence of mass lesion, ventriculomegaly, extra-axial collection or acute intracranial hemorrhage. Cervicomedullary junction and pituitary are within normal limits. Normal gray and white matter signal throughout the brain. No encephalomalacia or chronic cerebral blood products identified. Thin slice coronal imaging of the temporal lobes also provided. Hippocampal formations appear symmetric and within normal limits. No mesial temporal lobe signal abnormality identified. No migrational abnormality identified. No abnormal enhancement identified. Medial right cerebellum developmental venous anomaly (normal variant). No dural thickening. Normal cavernous sinus. Vascular: Major intracranial vascular flow voids are preserved. The major dural venous sinuses are enhancing and appear to be patent. Skull and upper cervical spine: Negative. Sinuses/Orbits: Negative  orbits. Bilateral paranasal sinus mucosal thickening and/or retention cysts. Small fluid level in the right sphenoid sinus. Symmetric nasal cavity mucosal thickening. Other: Trace mastoid air cell fluid. Negative visible nasopharynx. Visible internal auditory structures appear normal. Scalp and face appear negative. IMPRESSION: 1. Normal MRI appearance of the brain. 2. Paranasal sinus inflammation, rhinosinusitis. Electronically Signed   By: Genevie Ann M.D.   On: 03/18/2020 18:04   Subjective: No changes to symptoms overnight, "95% better" abnormal sensation in left arm, leg, hand, face. No new symptoms.  Discharge Exam: Vitals:   03/19/20 0737 03/19/20 0741  BP: 113/74   Pulse: 77   Resp:    Temp:    SpO2: 97% 96%   General: Pt is alert, awake, not in acute distress Cardiovascular: RRR, S1/S2 +, no rubs, no gallops Respiratory: CTA bilaterally, no wheezing, no rhonchi Abdominal: Soft, NT, ND, bowel sounds + Extremities: No edema, no cyanosis Neuro: Alert, oriented, no focal weakness noted. CN's II-XI tested and intact, though sensation on left upper and lower face felt to be different left to right. No apraxia.   Labs: BNP (last 3 results) No results for input(s): BNP in the last 8760 hours. Basic Metabolic Panel: Recent Labs  Lab 03/18/20 1238  NA 138  K 3.8  CL 103  CO2 25  GLUCOSE 98  BUN 16  CREATININE 0.99  CALCIUM 9.4   Liver Function Tests: Recent Labs  Lab 03/18/20 1238  AST 20  ALT 18  ALKPHOS 66  BILITOT 0.9  PROT 8.2*  ALBUMIN 4.8   No results for input(s): LIPASE, AMYLASE in the  last 168 hours. No results for input(s): AMMONIA in the last 168 hours. CBC: Recent Labs  Lab 03/18/20 1238  WBC 6.0  NEUTROABS 3.5  HGB 15.8  HCT 46.2  MCV 86.4  PLT 266   Cardiac Enzymes: No results for input(s): CKTOTAL, CKMB, CKMBINDEX, TROPONINI in the last 168 hours. BNP: Invalid input(s): POCBNP CBG: Recent Labs  Lab 03/18/20 1238  GLUCAP 91   D-Dimer No  results for input(s): DDIMER in the last 72 hours. Hgb A1c No results for input(s): HGBA1C in the last 72 hours. Lipid Profile No results for input(s): CHOL, HDL, LDLCALC, TRIG, CHOLHDL, LDLDIRECT in the last 72 hours. Thyroid function studies Recent Labs    03/18/20 1238  TSH 2.011   Anemia work up Recent Labs    03/18/20 1239  VITAMINB12 249   Urinalysis    Component Value Date/Time   COLORURINE YELLOW (A) 10/15/2019 0500   APPEARANCEUR CLEAR (A) 10/15/2019 0500   APPEARANCEUR Clear 07/09/2019 1452   LABSPEC >1.046 (H) 10/15/2019 0500   PHURINE 5.0 10/15/2019 0500   GLUCOSEU NEGATIVE 10/15/2019 0500   HGBUR NEGATIVE 10/15/2019 0500   BILIRUBINUR NEGATIVE 10/15/2019 0500   BILIRUBINUR Negative 07/09/2019 1452   KETONESUR NEGATIVE 10/15/2019 0500   PROTEINUR NEGATIVE 10/15/2019 0500   NITRITE NEGATIVE 10/15/2019 0500   LEUKOCYTESUR NEGATIVE 10/15/2019 0500    Microbiology Recent Results (from the past 240 hour(s))  Respiratory Panel by RT PCR (Flu A&B, Covid) - Nasopharyngeal Swab     Status: None   Collection Time: 03/18/20  2:57 PM   Specimen: Nasopharyngeal Swab  Result Value Ref Range Status   SARS Coronavirus 2 by RT PCR NEGATIVE NEGATIVE Final    Comment: (NOTE) SARS-CoV-2 target nucleic acids are NOT DETECTED.  The SARS-CoV-2 RNA is generally detectable in upper respiratoy specimens during the acute phase of infection. The lowest concentration of SARS-CoV-2 viral copies this assay can detect is 131 copies/mL. A negative result does not preclude SARS-Cov-2 infection and should not be used as the sole basis for treatment or other patient management decisions. A negative result may occur with  improper specimen collection/handling, submission of specimen other than nasopharyngeal swab, presence of viral mutation(s) within the areas targeted by this assay, and inadequate number of viral copies (<131 copies/mL). A negative result must be combined with  clinical observations, patient history, and epidemiological information. The expected result is Negative.  Fact Sheet for Patients:  PinkCheek.be  Fact Sheet for Healthcare Providers:  GravelBags.it  This test is no t yet approved or cleared by the [place] FDA and  has been authorized for detection and/or diagnosis of SARS-CoV-2 by FDA under an Emergency Use Authorization (EUA). This EUA will remain  in effect (meaning this test can be used) for the duration of the COVID-19 declaration under Section 564(b)(1) of the Act, 21 U.S.C. section 360bbb-3(b)(1), unless the authorization is terminated or revoked sooner.     Influenza A by PCR NEGATIVE NEGATIVE Final   Influenza B by PCR NEGATIVE NEGATIVE Final    Comment: (NOTE) The Xpert Xpress SARS-CoV-2/FLU/RSV assay is intended as an aid in  the diagnosis of influenza from Nasopharyngeal swab specimens and  should not be used as a sole basis for treatment. Nasal washings and  aspirates are unacceptable for Xpert Xpress SARS-CoV-2/FLU/RSV  testing.  Fact Sheet for Patients: PinkCheek.be  Fact Sheet for Healthcare Providers: GravelBags.it  This test is not yet approved or cleared by the [place] FDA and  has been  authorized for detection and/or diagnosis of SARS-CoV-2 by  FDA under an Emergency Use Authorization (EUA). This EUA will remain  in effect (meaning this test can be used) for the duration of the  Covid-19 declaration under Section 564(b)(1) of the Act, 21  U.S.C. section 360bbb-3(b)(1), unless the authorization is  terminated or revoked. Performed at [facility], [address] [place], [place] [address]     Time coordinating discharge: Approximately 40 minutes  Patrecia Pour, MD  [facility] 03/19/2020, 10:42 AM

## 2020-03-21 ENCOUNTER — Ambulatory Visit
Admission: EM | Admit: 2020-03-21 | Discharge: 2020-03-21 | Disposition: A | Discharge status: Home / Self Care | Payer: 59 | Attending: Orthopedic Surgery | Admitting: Orthopedic Surgery

## 2020-03-21 ENCOUNTER — Other Ambulatory Visit: Payer: Self-pay

## 2020-03-21 ENCOUNTER — Encounter: Payer: Self-pay | Admitting: Emergency Medicine

## 2020-03-21 DIAGNOSIS — R1031 Right lower quadrant pain: Secondary | ICD-10-CM | POA: Diagnosis not present

## 2020-03-21 MED ORDER — TRAMADOL HCL 50 MG PO TABS
50.0000 mg | ORAL_TABLET | Freq: Two times a day (BID) | ORAL | 0 refills | Status: DC | PRN
Start: 2020-03-21 — End: 2020-07-20

## 2020-03-21 NOTE — ED Triage Notes (Signed)
Patient c/o hard knot on the right side ob his abdomen since 3 days ago.  Patient reports ongoing pain in that area for a month.  Patient denies fevers.  Patient denies vomiting.  Patient reports N/D.

## 2020-03-21 NOTE — Discharge Instructions (Addendum)
Please take Tylenol and tramadol as needed for pain.  If any increasing pain, vomiting, fevers, constipation go to the emergency department immediately.  Please keep your follow-up appointment with PCP

## 2020-03-21 NOTE — ED Provider Notes (Signed)
[facility]    CSN: [id number] Arrival date & time: 03/21/20  1415      History   Chief Complaint Chief Complaint  Patient presents with  . Abdominal Pain    HPI Tony [name] is a 29 y.o. male presents to the urgent care facility for evaluation of right sided abdominal pain.  Pain has been present for greater than 1 month.  He denies any fevers, changes in bowel, nausea, vomiting.  He is tolerating p.o. well.  He states his pain is intermittent.  3 days ago his girlfriend felt a knot in the right lower quadrant of the abdomen where he previously underwent hernia repair and recommended he come in to be seen.  Patient has sharp pain that is worse to touch.  No pain with rest.  He denies any fevers.  No urinary symptoms or back pain.  He has not been take any medications for the pain.  He has a follow-up appointment with PCP in 2 weeks  HPI  Past Medical History:  Diagnosis Date  . Anemia    vitamin d and b12 deficiency  . Colitis   . Headache    multiple/day.  . Seizures ([place])    seizure like activity.  has not had any symptoms since Topamax (march 2020)  . Tubular adenoma of colon    had polypectomy     Patient Active Problem List   Diagnosis Date Noted  . Acute left-sided weakness 03/18/2020  . Change in bowel function   . Seizure-like activity ([place]) 02/08/2019  . Inflammatory bowel disease 02/08/2019  . Back muscle spasm 02/08/2019  . Depression 02/08/2019    Past Surgical History:  Procedure Laterality Date  . COLONOSCOPY WITH PROPOFOL N/A 03/08/2019   Procedure: COLONOSCOPY WITH PROPOFOL;  Surgeon: Lucilla Lame, MD;  Location: [place];  Service: Endoscopy;  Laterality: N/A;  . HERNIA REPAIR    . INGUINAL HERNIA REPAIR Right 07/30/2019   Procedure: LAPAROSCOPIC INGUINAL HERNIA;  Surgeon: Olean Ree, MD;  Location: [facility];  Service: General;  Laterality: Right;  . LIPOMA EXCISION  2019   lower right back  . POLYPECTOMY  03/08/2019    Procedure: POLYPECTOMY;  Surgeon: Lucilla Lame, MD;  Location: [place];  Service: Endoscopy;;  . TONSILLECTOMY    . VASECTOMY  04/25/2019       Home Medications    Prior to Admission medications   Medication Sig Start Date End Date Taking? Authorizing Provider  aspirin EC 81 MG EC tablet Take 1 tablet (81 mg total) by mouth daily. 03/20/20  Yes Patrecia Pour, MD  acetaminophen (TYLENOL) 500 MG tablet Take 1,000 mg by mouth every 6 (six) hours as needed (for pain.).    [provider]  Cholecalciferol (VITAMIN D3) 50 MCG (2000 UT) TABS Take 2,000 Units by mouth at bedtime. Patient not taking: Reported on 03/18/2020    [provider]  cyanocobalamin (,VITAMIN B-12,) 1000 MCG/ML injection inject 31mL IM once a week for four weeks then once a month for four months. 03/26/19   [provider]  ibuprofen (ADVIL) 600 MG tablet Take 1 tablet (600 mg total) by mouth every 8 (eight) hours as needed for mild pain or moderate pain. 07/30/19   Olean Ree, MD  topiramate (TOPAMAX) 25 MG tablet Take 50 mg by mouth at bedtime.  Patient not taking: Reported on 03/18/2020 03/21/19   [provider]  traMADol (ULTRAM) 50 MG tablet Take 1 tablet (50 mg total)  by mouth every 12 (twelve) hours as needed. 03/21/20   Duanne Guess, PA-C  vitamin B-12 (CYANOCOBALAMIN) 1000 MCG tablet Take 1 tablet (1,000 mcg total) by mouth daily. 03/19/20   Patrecia Pour, MD  Mesalamine (ASACOL) 400 MG CPDR DR capsule Take 2 capsules (800 mg total) by mouth 3 (three) times daily. 03/14/19 07/08/19  Lucilla Lame, MD    Family History Family History  Problem Relation Age of Onset  . Crohn's disease Mother   . Healthy Father     Social History Social History   Tobacco Use  . Smoking status: Former Smoker    Years: 2.00    Types: Cigarettes    Quit date: 02/2017    Years since quitting: 3.1  . Smokeless tobacco: Never Used  . Tobacco comment: 1 pack per month  Vaping  Use  . Vaping Use: Former  . Start date: 08/04/2016  . Quit date: 02/04/2017  Substance Use Topics  . Alcohol use: Yes    Alcohol/week: 3.0 standard drinks    Types: 3 Cans of beer per week    Comment:    . Drug use: Never     Allergies   Patient has no known allergies.   Review of Systems Review of Systems  Constitutional: Negative for chills and fever.  Respiratory: Negative for shortness of breath.   Gastrointestinal: Positive for abdominal pain. Negative for constipation, diarrhea, nausea and vomiting.  Genitourinary: Negative for dysuria.  Musculoskeletal: Negative for back pain.  Skin: Negative for wound.     Physical Exam Triage Vital Signs ED Triage Vitals  Enc Vitals Group     BP 03/21/20 1431 121/76     Pulse Rate 03/21/20 1431 71     Resp 03/21/20 1431 16     Temp 03/21/20 1431 98.8 F (37.1 C)     Temp Source 03/21/20 1431 Oral     SpO2 03/21/20 1431 99 %     Weight 03/21/20 1428 200 lb (90.7 kg)     Height 03/21/20 1428 5\' 10"  (1.778 m)     Head Circumference --      Peak Flow --      Pain Score 03/21/20 1428 5     Pain Loc --      Pain Edu? --      Excl. in [place]? --    No data found.  Updated Vital Signs BP 121/76 (BP Location: Left Arm)   Pulse 71   Temp 98.8 F (37.1 C) (Oral)   Resp 16   Ht 5\' 10"  (1.778 m)   Wt 200 lb (90.7 kg)   SpO2 99%   BMI 28.70 kg/m   Visual Acuity Right Eye Distance:   Left Eye Distance:   Bilateral Distance:    Right Eye Near:   Left Eye Near:    Bilateral Near:     Physical Exam Constitutional:      Appearance: He is well-developed.  HENT:     Head: Normocephalic and atraumatic.  Eyes:     Conjunctiva/sclera: Conjunctivae normal.  Cardiovascular:     Rate and Rhythm: Normal rate.  Pulmonary:     Effort: Pulmonary effort is normal. No respiratory distress.  Abdominal:     General: Abdomen is flat. Bowel sounds are normal. There is no distension.     Palpations: Abdomen is soft. There is no mass.       Tenderness: There is abdominal tenderness. There is no right CVA tenderness or left CVA  tenderness.     Hernia: No hernia is present.     Comments: Mild tenderness in the right lower quadrant, incision sites from recent hernia repair, 1 year ago are intact with no signs of any infection.  There is some palpable scar tissue noted along the incision sites that seems to be tender to palpation.  No palpable hernia with coughing or vagal stress.  There is no distention.  Patient only has some mild discomfort with deep palpation but no grimacing or rebound tenderness.  Musculoskeletal:        General: Normal range of motion.     Cervical back: Normal range of motion.  Skin:    General: Skin is warm.     Findings: No rash.  Neurological:     Mental Status: He is alert and oriented to person, place, and time.  Psychiatric:        Behavior: Behavior normal.        Thought Content: Thought content normal.      UC Treatments / Results  Labs (all labs ordered are listed, but only abnormal results are displayed) Labs Reviewed - No data to display  EKG   Radiology No results found.  Procedures Procedures (including critical care time)  Medications Ordered in UC Medications - No data to display  Initial Impression / Assessment and Plan / UC Course  I have reviewed the triage vital signs and the nursing notes.  Pertinent labs & imaging results that were available during my care of the patient were reviewed by me and considered in my medical decision making (see chart for details).     29 year old male with chronic right lower quadrant abdominal pain.  Patient underwent a repair grade 1 year ago with mesh.  Pain in the right lower quadrant is intermittent but worse to touch.  He denies any fevers, body aches, nausea vomiting diarrhea.  Bowels moving well.  Tolerating p.o. well.  Patient was given prescription for tramadol.  He will follow-up PCP in the next 1 to 2 weeks.  Appointment  has been scheduled.  Recommend he undergo CT scan.  He will return to the ER soon as possible if any increasing pain, fevers, nausea, vomiting or changes in bowels. Final Clinical Impressions(s) / UC Diagnoses   Final diagnoses:  Right lower quadrant abdominal pain chronic     Discharge Instructions     Please take Tylenol and tramadol as needed for pain.  If any increasing pain, vomiting, fevers, constipation go to the emergency department immediately.  Please keep your follow-up appointment with PCP   ED Prescriptions    Medication Sig Dispense Auth. Provider   traMADol (ULTRAM) 50 MG tablet Take 1 tablet (50 mg total) by mouth every 12 (twelve) hours as needed. 20 tablet Duanne Guess, PA-C     I have reviewed the PDMP during this encounter.   Duanne Guess, [place] 03/21/20 [phone]

## 2020-04-03 ENCOUNTER — Ambulatory Visit (INDEPENDENT_AMBULATORY_CARE_PROVIDER_SITE_OTHER): Payer: 59 | Admitting: Internal Medicine

## 2020-04-03 ENCOUNTER — Encounter: Payer: Self-pay | Admitting: Internal Medicine

## 2020-04-03 ENCOUNTER — Other Ambulatory Visit: Payer: Self-pay

## 2020-04-03 VITALS — BP 114/78 | HR 75 | Temp 98.2°F | Ht 70.0 in | Wt 207.0 lb

## 2020-04-03 DIAGNOSIS — G43109 Migraine with aura, not intractable, without status migrainosus: Secondary | ICD-10-CM | POA: Diagnosis not present

## 2020-04-03 DIAGNOSIS — K529 Noninfective gastroenteritis and colitis, unspecified: Secondary | ICD-10-CM | POA: Diagnosis not present

## 2020-04-03 NOTE — Patient Instructions (Addendum)
Start a Probiotic supplement  I will message Dr. Dorothey Baseman nurse to call you for follow up.

## 2020-04-03 NOTE — Progress Notes (Signed)
Date:  04/03/2020   Name:  Tony [name]   DOB:  03-Sep-1990   MRN:  [id number]   Chief Complaint: Abdominal Pain (Diarrhea X 1 month. Steady/ constant. Refer to [facility] doctor. Had a hernia repair surgery and has felt right in abdomen since. ) Follow up abdominal pain.  Was seen in [facility]  03/2015. Examiners comments: Mild tenderness in the right lower quadrant, incision sites from recent hernia repair, 1 year ago are intact with no signs of any infection.  There is some palpable scar tissue noted along the incision sites that seems to be tender to palpation.  No palpable hernia with coughing or vagal stress.  There is no distention.  Patient only has some mild discomfort with deep palpation but no grimacing or rebound tenderness.  He was prescribed Tramadol.  Also admitted to [facility] 1013/21 with seizure like activity. He was seen by Neurology in follow up 03/25/20. Summary of neurology note: PLAN: 1. Discussed epileptic vs non-epileptic episodes.  2. Discussed duloxetine 30mg  qhs for headaches and depression 3. Follow up in headache clinic.  4. Psychiatry and psychology consult placed.  5. Neuropsych consult placed to r/o memory issues 2/2 concussion 6. Follow up with me once in 3 months 7. OSH medical records: [facility].   Discharge Diagnoses:  Active Problems:   Seizure-like activity ([place])   Acute left-sided weakness No seizures identified on EEG, no stroke on neuroimaging. B12 low normal at 249. Vitamin D pending. UDS pending. Unclear etiology of presentation but may be secondary to vitamin deficiencies.  1. Continued B12 supplementation 2. Vitamin D level to be followed up and supplemented if necessary  >>vitamin D low = 23 3. Continue ASA 81mg  daily 4. Patient to continue follow up with Dr Manuella Ghazi at [facility] 5.Patient unable to drive, operate heavy machinery, perform activities at heights and participate in water activities until release by outpatient  physician.  Discharge Instructions     Discharge Instructions    Discharge instructions   Complete by: As directed    1. Continue B12 supplementation (new prescription sent [facility] your [facility]), and vitamin D supplementation (available OTC) 3. Continue Aspirin 81mg  daily (prescription sent to your [facility]) 4. Continue follow up with Dr Manuella Ghazi at [facility] 5. Patient unable to drive, operate heavy machinery, perform activities at heights and participate in water activities until release by outpatient physician. 6. If your symptoms return or worsen, seek medical attention right away. Otherwise, follow up with your PCP in the next 1-2 weeks.    Diarrhea  This is a new problem. The current episode started 1 to 4 weeks ago. The problem occurs 2 to 4 times per day. The problem has been unchanged. The stool consistency is described as watery. The patient states that diarrhea awakens him from sleep. Associated symptoms include abdominal pain, bloating, increased flatus and sweats. Pertinent negatives include no chills, fever, headaches or vomiting. His past medical history is significant for inflammatory bowel disease. colonoscopy done last year - not sure what the treatment plan was  Migraine  This is a new (seen by Neurology and felt to be complicated migraines) problem. Associated symptoms include abdominal pain. Pertinent negatives include no dizziness, fever or vomiting. Treatments tried: started on Cymbalta 30 mg. (Colonoscopy done last year - not sure what the treatment plan was)  Neurology also referred him to Psychiatry and psychology for depression and to Neuropsych testing for memory loss s/p concussion.  Lab Results  Component Value  Date   CREATININE 0.99 03/18/2020   BUN 16 03/18/2020   NA 138 03/18/2020   K 3.8 03/18/2020   CL 103 03/18/2020   CO2 25 03/18/2020   No results found for: CHOL, HDL, LDLCALC, LDLDIRECT, TRIG, CHOLHDL Lab Results  Component Value Date   TSH 2.011  03/18/2020   No results found for: HGBA1C Lab Results  Component Value Date   WBC 6.0 03/18/2020   HGB 15.8 03/18/2020   HCT 46.2 03/18/2020   MCV 86.4 03/18/2020   PLT 266 03/18/2020   Lab Results  Component Value Date   ALT 18 03/18/2020   AST 20 03/18/2020   ALKPHOS 66 03/18/2020   BILITOT 0.9 03/18/2020     Review of Systems  Constitutional: Negative for appetite change, chills, diaphoresis, fatigue, fever and unexpected weight change.  Respiratory: Negative for chest tightness and shortness of breath.   Cardiovascular: Negative for chest pain.  Gastrointestinal: Positive for abdominal pain, bloating, diarrhea and flatus. Negative for blood in stool, constipation, rectal pain and vomiting.  Neurological: Positive for syncope and light-headedness. Negative for dizziness and headaches.  Psychiatric/Behavioral: Positive for dysphoric mood and sleep disturbance.    Patient Active Problem List   Diagnosis Date Noted  . Acute left-sided weakness 03/18/2020  . Change in bowel function   . Seizure-like activity ([place]) 02/08/2019  . Inflammatory bowel disease 02/08/2019  . Back muscle spasm 02/08/2019  . Depression 02/08/2019    No Known Allergies  Past Surgical History:  Procedure Laterality Date  . COLONOSCOPY WITH PROPOFOL N/A 03/08/2019   Procedure: COLONOSCOPY WITH PROPOFOL;  Surgeon: Lucilla Lame, MD;  Location: [place];  Service: Endoscopy;  Laterality: N/A;  . HERNIA REPAIR    . INGUINAL HERNIA REPAIR Right 07/30/2019   Procedure: LAPAROSCOPIC INGUINAL HERNIA;  Surgeon: Olean Ree, MD;  Location: [facility];  Service: General;  Laterality: Right;  . LIPOMA EXCISION  2019   lower right back  . POLYPECTOMY  03/08/2019   Procedure: POLYPECTOMY;  Surgeon: Lucilla Lame, MD;  Location: [place];  Service: Endoscopy;;  . TONSILLECTOMY    . VASECTOMY  04/25/2019    Social History   Tobacco Use  . Smoking status: Former Smoker    Years: 2.00     Types: Cigarettes    Quit date: 02/2017    Years since quitting: 3.1  . Smokeless tobacco: Never Used  . Tobacco comment: 1 pack per month  Vaping Use  . Vaping Use: Former  . Start date: 08/04/2016  . Quit date: 02/04/2017  Substance Use Topics  . Alcohol use: Yes    Alcohol/week: 3.0 standard drinks    Types: 3 Cans of beer per week    Comment:    . Drug use: Never     Medication list has been reviewed and updated.  Current Meds  Medication Sig  . aspirin EC 81 MG EC tablet Take 1 tablet (81 mg total) by mouth daily.  . Cholecalciferol (VITAMIN D3) 50 MCG (2000 UT) TABS Take 2,000 Units by mouth at bedtime.   . topiramate (TOPAMAX) 25 MG tablet Take 50 mg by mouth at bedtime.   . traMADol (ULTRAM) 50 MG tablet Take 1 tablet (50 mg total) by mouth every 12 (twelve) hours as needed.  . vitamin B-12 (CYANOCOBALAMIN) 1000 MCG tablet Take 1 tablet (1,000 mcg total) by mouth daily.    PHQ 2/9 Scores 04/03/2020 02/08/2019  PHQ - 2 Score 0 1  PHQ- 9 Score 2  11    GAD 7 : Generalized Anxiety Score 04/03/2020 02/08/2019  Nervous, Anxious, on Edge 0 3  Control/stop worrying 0 0  Worry too much - different things 0 0  Trouble relaxing 0 3  Restless 0 3  Easily annoyed or irritable 0 2  Afraid - awful might happen 0 0  Total GAD 7 Score 0 11  Anxiety Difficulty Not difficult at all Not difficult at all    BP Readings from Last 3 Encounters:  04/03/20 114/78  03/21/20 121/76  03/19/20 120/73    Physical Exam Vitals and nursing note reviewed.  Constitutional:      General: He is not in acute distress.    Appearance: He is well-developed.  HENT:     Head: Normocephalic and atraumatic.  Cardiovascular:     Rate and Rhythm: Normal rate and regular rhythm.  Pulmonary:     Effort: Pulmonary effort is normal. No respiratory distress.     Breath sounds: Normal breath sounds.  Abdominal:     General: Abdomen is flat. A surgical scar is present. Bowel sounds are decreased.      Palpations: Abdomen is soft.     Tenderness: There is abdominal tenderness in the right lower quadrant, suprapubic area and left lower quadrant. There is no guarding or rebound.     Hernia: No hernia is present.  Musculoskeletal:        General: Normal range of motion.  Skin:    General: Skin is warm and dry.     Findings: No rash.  Neurological:     Mental Status: He is alert and oriented to person, place, and time.  Psychiatric:        Attention and Perception: Attention normal.        Mood and Affect: Mood normal.     Wt Readings from Last 3 Encounters:  04/03/20 207 lb (93.9 kg)  03/21/20 200 lb (90.7 kg)  03/18/20 200 lb (90.7 kg)    BP 114/78   Pulse 75   Temp 98.2 F (36.8 C) (Oral)   Ht 5\' 10"  (1.778 m)   Wt 207 lb (93.9 kg)   SpO2 98%   BMI 29.70 kg/m   Assessment and Plan: 1. Chronic diarrhea May be a flare of his IBD Will send message to Dr. Allen Norris to schedule him for follow up. Try probiotics daily  2. Complicated migraine Now on Cymbalta to help with depression and migraines Patient states that he is allowed to drive He is encouraged to proceed with Neuro-psych testing  3. Inflammatory bowel disease Seen on Colonoscopy last year Mesalamine recommended but it is unclear if he complied with medication - he can not recall.   Partially dictated using Editor, commissioning. Any errors are unintentional.  Halina Maidens, MD [facility]  04/03/2020

## 2020-04-14 ENCOUNTER — Other Ambulatory Visit: Payer: Self-pay | Admitting: Internal Medicine

## 2020-04-14 ENCOUNTER — Encounter: Payer: Self-pay | Admitting: Internal Medicine

## 2020-04-14 DIAGNOSIS — R198 Other specified symptoms and signs involving the digestive system and abdomen: Secondary | ICD-10-CM

## 2020-04-14 DIAGNOSIS — K529 Noninfective gastroenteritis and colitis, unspecified: Secondary | ICD-10-CM

## 2020-04-30 ENCOUNTER — Encounter: Payer: Self-pay | Admitting: Internal Medicine

## 2020-05-01 ENCOUNTER — Other Ambulatory Visit: Payer: Self-pay

## 2020-05-01 ENCOUNTER — Ambulatory Visit (INDEPENDENT_AMBULATORY_CARE_PROVIDER_SITE_OTHER): Payer: 59

## 2020-05-01 ENCOUNTER — Ambulatory Visit
Admission: RE | Admit: 2020-05-01 | Discharge: 2020-05-01 | Disposition: A | Discharge status: Home / Self Care | Payer: 59 | Source: Ambulatory Visit | Attending: Emergency Medicine | Admitting: Emergency Medicine

## 2020-05-01 VITALS — BP 129/90 | HR 78 | Temp 98.3°F

## 2020-05-01 DIAGNOSIS — G8929 Other chronic pain: Secondary | ICD-10-CM | POA: Diagnosis not present

## 2020-05-01 DIAGNOSIS — M545 Low back pain, unspecified: Secondary | ICD-10-CM

## 2020-05-01 DIAGNOSIS — R1031 Right lower quadrant pain: Secondary | ICD-10-CM

## 2020-05-01 MED ORDER — ACETAMINOPHEN 500 MG PO TABS
1000.0000 mg | ORAL_TABLET | Freq: Once | ORAL | Status: AC
  Administered 2020-05-01: 1000 mg via ORAL

## 2020-05-01 MED ORDER — METHYLPREDNISOLONE 4 MG PO TBPK: ORAL_TABLET | Freq: Every day | ORAL | 0 refills | Status: DC

## 2020-05-01 MED ORDER — KETOROLAC TROMETHAMINE 60 MG/2ML IM SOLN
30.0000 mg | Freq: Once | INTRAMUSCULAR | Status: AC
  Administered 2020-05-01: 30 mg via INTRAMUSCULAR

## 2020-05-01 MED ORDER — TIZANIDINE HCL 4 MG PO TABS: 4.0000 mg | ORAL_TABLET | Freq: Three times a day (TID) | ORAL | 0 refills | Status: DC | PRN

## 2020-05-01 MED ORDER — IBUPROFEN 600 MG PO TABS: 600.0000 mg | ORAL_TABLET | Freq: Four times a day (QID) | ORAL | 0 refills | Status: DC | PRN

## 2020-05-01 NOTE — Discharge Instructions (Addendum)
Your x-rays were normal.  Take 600 mg of ibuprofen combined with 1000 mg of Tylenol together 3-4 times a day as needed for pain.  Finish the steroids unless a provider tells you to stop, Zanaflex as needed for muscle spasms.  Many people find gentle stretching and deep tissue massage helpful. . Follow-up with orthopedics in 1 week if you are not getting better, go to the ER for the signs and symptoms we discussed.  Go to www.goodrx.com to look up your medications. This will give you a list of where you can find your prescriptions at the most affordable prices. Or ask the pharmacist what the cash price is, or if they have any other discount programs available to help make your medication more affordable. This can be less expensive than what you would pay with insurance.

## 2020-05-01 NOTE — ED Provider Notes (Signed)
HPI  SUBJECTIVE:  Tony [name] is a 29 y.o. male who presents with multiple issues.  His primary issue today is bilateral low back pain getting worse 2 weeks ago.  He states it is constant and changes in nature depending on his activity.  He states that it is sharp with movement.  He denies vomiting, fevers, urinary complaints, trauma to his back, recent heavy lifting more than 50 pounds.  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  No saddle anesthesia, urinary or fecal incontinence, urinary retention.  He states the pain is worse at night.  No unintentional weight loss, night sweats, leg weakness, distal numbness or tingling, bilateral radicular pain.  He has not had any change in his bowel habits recently.  He has had back pain like this "all my life", however it is getting worse than usual and not responding to usual measures.  He has tried seeing his chiropractor with temporary improvement in his symptoms.  He has also tried tramadol, ice, heat, rest without improvement in his symptoms.  Symptoms are worse with all movement.  He also reports sharp right groin pain that he has had since February 2021 after having a hernia repait but has gotten worse also over the past 2 weeks.  He states is present primarily with intercourse and with lifting heavy objects.  He states the pain is at the surgical site where the mesh is.  No scrotal or inguinal bulging, no testicular pain, swelling.  He has been evaluated for this before and it has been thought to be residual neuropathy from the surgery.  Patient was seen by PMD on 10/29 for abdominal pain and diarrhea for a month.  Referred to Dr. Allen [name], GI this is pending.  States abdominal pain, diarrhea has not changed since recent evaluation.  Patient has a past medical history of IBS with a colonoscopy that found to be very adenomatous done last year in October 2020.  He is also status post right inguinal hernia repair 07/30/2019, has a history  of pseudoseizures, complicated migraines.  No history of diabetes, hypertension, UTI, pyelonephritis, nephrolithiasis, cancer, multiple myeloma, IV drug use, aortic abdominal aneurysm.  [name], Tony Sans, MD   Past Medical History:  Diagnosis Date  . Anemia    vitamin d and b12 deficiency  . Colitis   . Headache    multiple/day.  . Seizures ([place])    seizure like activity.  has not had any symptoms since Topamax (march 2020)  . Tubular adenoma of colon    had polypectomy     Past Surgical History:  Procedure Laterality Date  . COLONOSCOPY WITH PROPOFOL N/A 03/08/2019   Procedure: COLONOSCOPY WITH PROPOFOL;  Surgeon: Lucilla Lame, MD;  Location: [facility];  Service: Endoscopy;  Laterality: N/A;  . HERNIA REPAIR    . INGUINAL HERNIA REPAIR Right 07/30/2019   Procedure: LAPAROSCOPIC INGUINAL HERNIA;  Surgeon: Olean Ree, MD;  Location: [facility];  Service: General;  Laterality: Right;  . LIPOMA EXCISION  2019   lower right back  . POLYPECTOMY  03/08/2019   Procedure: POLYPECTOMY;  Surgeon: Lucilla Lame, MD;  Location: [facility];  Service: Endoscopy;;  . TONSILLECTOMY    . VASECTOMY  04/25/2019    Family History  Problem Relation Age of Onset  . Crohn's disease Mother   . Healthy Father     Social History   Tobacco Use  . Smoking status: Former Smoker    Years:  2.00    Types: Cigarettes    Quit date: 02/2017    Years since quitting: 3.2  . Smokeless tobacco: Never Used  . Tobacco comment: 1 pack per month  Vaping Use  . Vaping Use: Former  . Start date: 08/04/2016  . Quit date: 02/04/2017  Substance Use Topics  . Alcohol use: Yes    Alcohol/week: 3.0 standard drinks    Types: 3 Cans of beer per week    Comment:    . Drug use: Never    No current facility-administered medications for this encounter.  Current Outpatient Medications:  .  DULoxetine (CYMBALTA) 30 MG capsule, Take 30 mg by mouth daily., Disp: , Rfl:  .  traMADol (ULTRAM) 50 MG  tablet, Take 1 tablet (50 mg total) by mouth every 12 (twelve) hours as needed., Disp: 20 tablet, Rfl: 0 .  vitamin B-12 (CYANOCOBALAMIN) 1000 MCG tablet, Take 1 tablet (1,000 mcg total) by mouth daily., Disp: 30 tablet, Rfl: 2 .  aspirin EC 81 MG EC tablet, Take 1 tablet (81 mg total) by mouth daily., Disp: 30 tablet, Rfl: 2 .  Cholecalciferol (VITAMIN D3) 50 MCG (2000 UT) TABS, Take 2,000 Units by mouth at bedtime. , Disp: , Rfl:  .  ibuprofen (ADVIL) 600 MG tablet, Take 1 tablet (600 mg total) by mouth every 6 (six) hours as needed., Disp: 30 tablet, Rfl: 0 .  methylPREDNISolone (MEDROL DOSEPAK) 4 MG TBPK tablet, Take by mouth daily. Follow package instructions, Disp: 21 tablet, Rfl: 0 .  tiZANidine (ZANAFLEX) 4 MG tablet, Take 1 tablet (4 mg total) by mouth every 8 (eight) hours as needed for muscle spasms., Disp: 30 tablet, Rfl: 0  No Known Allergies   ROS  As noted in HPI.   Physical Exam  BP 129/90 (BP Location: Left Arm)   Pulse 78   Temp 98.3 F (36.8 C)   SpO2 100%   Constitutional: Well developed, well nourished, no acute distress Eyes:  EOMI, conjunctiva normal bilaterally HENT: Normocephalic, atraumatic,mucus membranes moist Respiratory: Normal inspiratory effort Cardiovascular: Normal rate GI: nondistended.  Mildly tender surgical scar right lower quadrant.  Diffuse lower abdominal tenderness, patient states that this is not new.  No guarding, rebound.  Active bowel sounds. GU: Normal circumcised male, testes descended bilaterally.  No testicular or epididymal tenderness, swelling.  No scrotal swelling with Valsalva.  No appreciable inguinal hernia.  Positive tenderness along the right inguinal region.  Patient also states that this is not new.  Patient declined chaperone. skin: No rash, skin intact Musculoskeletal: + Bilateral paralumbar tenderness, + bilateral muscle spasm. No lumbar bony tenderness.  No SI joint tenderness.  Bilateral lower extremities nontender,  sensation to light touch and temperature between thighs intact, baseline ROM with intact DP pulses, pain aggravated with active hip flexion against resistance bilaterally.  No pain with int/ext rotation, extension hips bilaterally. SLR positive bilaterally. Sensation baseline light touch bilaterally for Pt, DTR's symmetric and intact bilaterally KJ , Motor symmetric bilateral 5/5 hip flexion, quadriceps, hamstrings, EHL, foot dorsiflexion, foot plantarflexion, gait normal. Neurologic: Alert & oriented x 3, no focal neuro deficits Psychiatric: Speech and behavior appropriate   ED Course   Medications  acetaminophen (TYLENOL) tablet 1,000 mg (1,000 mg Oral Given 05/01/20 2035)  ketorolac (TORADOL) injection 30 mg (30 mg Intramuscular Given 05/01/20 2036)    Orders Placed This Encounter  Procedures  . DG Lumbar Spine Complete    Standing Status:   Standing    Number of Occurrences:  1    Order Specific Question:   Reason for Exam (SYMPTOM  OR DIAGNOSIS REQUIRED)    Answer:   bilateral low back pain    No results found for this or any previous visit (from the past 24 hour(s)). DG Lumbar Spine Complete  Result Date: 05/01/2020 CLINICAL DATA:  Bilateral low back pain EXAM: LUMBAR SPINE - COMPLETE 4+ VIEW COMPARISON:  CT 10/15/2019 FINDINGS: There is no evidence of lumbar spine fracture. Alignment is normal. Intervertebral disc spaces are maintained. IMPRESSION: Negative. Electronically Signed   By: Donavan Foil M.D.   On: 05/01/2020 20:41    ED Clinical Impression  1. Acute exacerbation of chronic low back pain   2. Chronic groin pain, right      ED Assessment/Plan  Outside records reviewed.  As noted in HPI.  Reviewed imaging independently.  Normal films.  No fracture.  See radiology report for full details.  Patient with multiple issues.  Primary issue is the back pain which appears to be very musculoskeletal.  However he is describing a few red flags such as pain worse at  night, I do not see need record of plain films, so  will get an L-spine.  Do not think that he needs an emergent MRI tonight.  Give Toradol 30 mg IM  Tylenol 1000 mg p.o. here.if films are negative for any acute process, plan to send home with Zanaflex, Medrol Dosepak, Tylenol/ibuprofen, advise deep tissue massage and stretching.  Ortho in 1 week if not better.  He also reports worsening of his groin pain which has been present since his surgery in February 2021.  I suspect his residual nerve damage versus scar tissue.  There does not appear to be any evidence of infection or recurrent hernia.  Advised him to follow-up with his surgeon regarding this.  Abdominal pain has been going on for over 2 months and while he does have lower abdominal tenderness, there is no guarding or rebound.  He is in the process of seeing GI for this.  This is not his primary concern today.  Imaging independently reviewed.  Normal films.  Plan as above.  Discussed imaging, MDM, treatment plan, and plan for follow-up with patient. Discussed sn/sx that should prompt return to the ED. patient agrees with plan.   Meds ordered this encounter  Medications  . acetaminophen (TYLENOL) tablet 1,000 mg  . ketorolac (TORADOL) injection 30 mg  . ibuprofen (ADVIL) 600 MG tablet    Sig: Take 1 tablet (600 mg total) by mouth every 6 (six) hours as needed.    Dispense:  30 tablet    Refill:  0  . tiZANidine (ZANAFLEX) 4 MG tablet    Sig: Take 1 tablet (4 mg total) by mouth every 8 (eight) hours as needed for muscle spasms.    Dispense:  30 tablet    Refill:  0  . methylPREDNISolone (MEDROL DOSEPAK) 4 MG TBPK tablet    Sig: Take by mouth daily. Follow package instructions    Dispense:  21 tablet    Refill:  0    *This clinic note was created using Dragon dictation software. Therefore, there may be occasional mistakes despite careful proofreading.   ?    Melynda Ripple, MD 05/02/20 [phone]

## 2020-05-01 NOTE — ED Triage Notes (Signed)
Pt c/o lower back pain x 2 weeks. Pt also has groin pain right side x 2 weeks. Pt also has lower abd pain x 2 months. Pt c/o nausea, diarrhea, excessive gas, and some dark stools. He denies any rectal bleeding. He has a referral in place to a GI specialist. He also sees a chiropractor for his back. Pt states he has a hx of hernia in his groin that was repaired earlier this yesterday.

## 2020-05-04 ENCOUNTER — Other Ambulatory Visit: Payer: Self-pay

## 2020-05-04 DIAGNOSIS — R198 Other specified symptoms and signs involving the digestive system and abdomen: Secondary | ICD-10-CM

## 2020-05-04 DIAGNOSIS — K529 Noninfective gastroenteritis and colitis, unspecified: Secondary | ICD-10-CM

## 2020-05-04 NOTE — Telephone Encounter (Signed)
Received a chat messaged from our referrals coordinator. She said she was in Contact with [facility] and was told that they denied the referral for the patient. They did not state why but they did state they send a FAX to our office to let us know. Sent patient a my chart message to let him know and asked him if he would like [place] to send him to a local GI doctor instead. Awaiting response from the patient.

## 2020-05-15 ENCOUNTER — Other Ambulatory Visit: Payer: Self-pay

## 2020-05-15 ENCOUNTER — Ambulatory Visit (INDEPENDENT_AMBULATORY_CARE_PROVIDER_SITE_OTHER): Payer: 59 | Admitting: Surgery

## 2020-05-15 ENCOUNTER — Encounter: Payer: Self-pay | Admitting: Surgery

## 2020-05-15 VITALS — BP 117/74 | HR 67 | Temp 98.0°F | Ht 70.0 in | Wt 205.6 lb

## 2020-05-15 DIAGNOSIS — G8929 Other chronic pain: Secondary | ICD-10-CM

## 2020-05-15 DIAGNOSIS — R1031 Right lower quadrant pain: Secondary | ICD-10-CM | POA: Diagnosis not present

## 2020-05-15 NOTE — Patient Instructions (Addendum)
Alternate between ice and heat on the painful areas and should be helpful. You can take Tylenol and Ibuprofen for the pain. We will get a CT done to determine the next steps.  Your CT is scheduled for 12/23 @ 3:30 pm, arrive at 3 pm. Please pick up contrast between now and 12/22. You will need to have labs done at [facility] before your CT appointment.    Umbilical Hernia, Adult  A hernia is a bulge of tissue that pushes through an opening between muscles. An umbilical hernia happens in the abdomen, near the belly button (umbilicus). The hernia may contain tissues from the small intestine, large intestine, or fatty tissue covering the intestines (omentum). Umbilical hernias in adults tend to get worse over time, and they require surgical treatment. There are several types of umbilical hernias. You may have:  A hernia located just above or below the umbilicus (indirect hernia). This is the most common type of umbilical hernia in adults.  A hernia that forms through an opening formed by the umbilicus (direct hernia).  A hernia that comes and goes (reducible hernia). A reducible hernia may be visible only when you strain, lift something heavy, or cough. This type of hernia can be pushed back into the abdomen (reduced).  A hernia that traps abdominal tissue inside the hernia (incarcerated hernia). This type of hernia cannot be reduced.  A hernia that cuts off blood flow to the tissues inside the hernia (strangulated hernia). The tissues can start to die if this happens. This type of hernia requires emergency treatment. What are the causes? An umbilical hernia happens when tissue inside the abdomen presses on a weak area of the abdominal muscles. What increases the risk? You may have a greater risk of this condition if you:  Are obese.  Have had several pregnancies.  Have a buildup of fluid inside your abdomen (ascites).  Have had surgery that weakens the abdominal muscles. What are the signs or  symptoms? The main symptom of this condition is a painless bulge at or near the belly button. A reducible hernia may be visible only when you strain, lift something heavy, or cough. Other symptoms may include:  Dull pain.  A feeling of pressure. Symptoms of a strangulated hernia may include:  Pain that gets increasingly worse.  Nausea and vomiting.  Pain when pressing on the hernia.  Skin over the hernia becoming red or purple.  Constipation.  Blood in the stool. How is this diagnosed? This condition may be diagnosed based on:  A physical exam. You may be asked to cough or strain while standing. These actions increase the pressure inside your abdomen and force the hernia through the opening in your muscles. Your health care provider may try to reduce the hernia by pressing on it.  Your symptoms and medical history. How is this treated? Surgery is the only treatment for an umbilical hernia. Surgery for a strangulated hernia is done as soon as possible. If you have a small hernia that is not incarcerated, you may need to lose weight before having surgery. Follow these instructions at home:  Lose weight, if told by your health care provider.  Do not try to push the hernia back in.  Watch your hernia for any changes in color or size. Tell your health care provider if any changes occur.  You may need to avoid activities that increase pressure on your hernia.  Do not lift anything that is heavier than 10 lb (4.5 kg) until  your health care provider says that this is safe.  Take over-the-counter and prescription medicines only as told by your health care provider.  Keep all follow-up visits as told by your health care provider. This is important. Contact a health care provider if:  Your hernia gets larger.  Your hernia becomes painful. Get help right away if:  You develop sudden, severe pain near the area of your hernia.  You have pain as well as nausea or vomiting.  You  have pain and the skin over your hernia changes color.  You develop a fever. This information is not intended to replace advice given to you by your health care provider. Make sure you discuss any questions you have with your health care provider. Document Revised: 07/05/2017 Document Reviewed: 11/21/2016 Elsevier Patient Education  [place].

## 2020-05-15 NOTE — Progress Notes (Signed)
05/15/2020  History of Present Illness: Tony [name] is a 29 y.o. male s/p laparoscopic right inguinal hernia repair with mesh on 07/30/19.  He presents today for follow up due to ongoing right groin and abdominal pain.  The patient had been seen last on 5/21 after an ED visit due to right groin pain after his child kicked him accidentally.  His pain was fairly minimal requiring only Tylenol.  He reports that pain subsided, but about 2-3 months ago, he did heavy lifting, moving a refrigerator up a set of stairs without help.  He started having abdominal pain centered around the incisions and also in the right groin.  He denies any bulging sensation, but reports that he pain has been ongoing since then.  He has been doing more lifting and has more soreness after each time.  He reports also some pain in the right groin with urination and also some pain with ejaculation.  Has been taking Tylenol, Ibuprofen, and occasionally Tramadol.    Past Medical History: Past Medical History:  Diagnosis Date  . Anemia    vitamin d and b12 deficiency  . Colitis   . Headache    multiple/day.  . Seizures ([place])    seizure like activity.  has not had any symptoms since Topamax (march 2020)  . Tubular adenoma of colon    had polypectomy      Past Surgical History: Past Surgical History:  Procedure Laterality Date  . COLONOSCOPY WITH PROPOFOL N/A 03/08/2019   Procedure: COLONOSCOPY WITH PROPOFOL;  Surgeon: Lucilla Lame, MD;  Location: [place];  Service: Endoscopy;  Laterality: N/A;  . HERNIA REPAIR    . INGUINAL HERNIA REPAIR Right 07/30/2019   Procedure: LAPAROSCOPIC INGUINAL HERNIA;  Surgeon: Olean Ree, MD;  Location: [facility];  Service: General;  Laterality: Right;  . LIPOMA EXCISION  2019   lower right back  . POLYPECTOMY  03/08/2019   Procedure: POLYPECTOMY;  Surgeon: Lucilla Lame, MD;  Location: [place];  Service: Endoscopy;;  . TONSILLECTOMY    . VASECTOMY  04/25/2019     Home Medications: Prior to Admission medications   Medication Sig Start Date End Date Taking? Authorizing Provider  DULoxetine (CYMBALTA) 30 MG capsule Take 30 mg by mouth daily.   Yes [provider]  ibuprofen (ADVIL) 600 MG tablet Take 1 tablet (600 mg total) by mouth every 6 (six) hours as needed. 05/01/20  Yes Melynda Ripple, MD  tiZANidine (ZANAFLEX) 4 MG tablet Take 1 tablet (4 mg total) by mouth every 8 (eight) hours as needed for muscle spasms. 05/01/20  Yes Melynda Ripple, MD  traMADol (ULTRAM) 50 MG tablet Take 1 tablet (50 mg total) by mouth every 12 (twelve) hours as needed. 03/21/20  Yes Duanne Guess, PA-C  vitamin B-12 (CYANOCOBALAMIN) 1000 MCG tablet Take 1 tablet (1,000 mcg total) by mouth daily. 03/19/20  Yes Patrecia Pour, MD  Mesalamine (ASACOL) 400 MG CPDR DR capsule Take 2 capsules (800 mg total) by mouth 3 (three) times daily. 03/14/19 07/08/19  Lucilla Lame, MD    Allergies: No Known Allergies  Review of Systems: Review of Systems  Constitutional: Negative for chills and fever.  Respiratory: Negative for shortness of breath.   Cardiovascular: Negative for chest pain.  Gastrointestinal: Positive for abdominal pain. Negative for constipation, diarrhea, nausea and vomiting.  Genitourinary: Negative for dysuria (pain in right groin with urination).  Skin: Negative for rash.    Physical Exam BP 117/74   Pulse 67  Temp 98 F (36.7 C) (Oral)   Ht 5\' 10"  (1.778 m)   Wt 205 lb 9.6 oz (93.3 kg)   SpO2 97%   BMI 29.50 kg/m  CONSTITUTIONAL: No acute distress HEENT:  Normocephalic, atraumatic, extraocular motion intact. RESPIRATORY:  Normal respiratory effort without pathologic use of accessory muscles. CARDIOVASCULAR:  Regular rhythm and rate. GI: The abdomen is soft, non-distended, with tenderness over the umbilicus and the right groin.  On palpation at the umbilicus, the patient may have a small incisional hernia, though there is no gross  visual bulging seen, but can palpate a small amount of bulging.   No evidence of recurrent hernia in the right groin.  No swelling or masses palpable. GU:  Scrotum normal size without swelling or masses. NEUROLOGIC:  Motor and sensation is grossly normal.  Cranial nerves are grossly intact. PSYCH:  Alert and oriented to person, place and time. Affect is normal.  Labs/Imaging: None recently.  Assessment and Plan: This is a 29 y.o. male with new onset of pain ongoing for about 2-3 months after heavy lifting.  --Discussed with the patient that he may have an incisional hernia in the umbilicus.  This could be the cause of his periumbilical pain.  I do not feel any recurrence in the right groin, and do not feel any bulging on coughing/straining.  It could be some referred pain from the umbilical hernia, but could also be aggravation of scarring that's formed after his laparoscopic surgery.  It would be best to obtain a CT scan of abdomen/pelvis to further evaluate. --Discussed with the patient that based on the results, I will call him or set up an appointment to discuss further.  It may be that we have to start medication management for chronic pain after hernia repair.    Face-to-face time spent with the patient and care providers was 25 minutes, with more than 50% of the time spent counseling, educating, and coordinating care of the patient.     Melvyn Neth, [facility]

## 2020-05-25 ENCOUNTER — Other Ambulatory Visit: Payer: Self-pay

## 2020-05-25 ENCOUNTER — Encounter: Payer: Self-pay | Admitting: Emergency Medicine

## 2020-05-25 ENCOUNTER — Emergency Department
Admission: EM | Admit: 2020-05-25 | Discharge: 2020-05-25 | Disposition: A | Discharge status: Home / Self Care | Payer: 59 | Attending: Emergency Medicine | Admitting: Emergency Medicine

## 2020-05-25 ENCOUNTER — Emergency Department: Payer: 59

## 2020-05-25 DIAGNOSIS — Z48815 Encounter for surgical aftercare following surgery on the digestive system: Secondary | ICD-10-CM | POA: Diagnosis not present

## 2020-05-25 DIAGNOSIS — K529 Noninfective gastroenteritis and colitis, unspecified: Secondary | ICD-10-CM | POA: Insufficient documentation

## 2020-05-25 DIAGNOSIS — R109 Unspecified abdominal pain: Secondary | ICD-10-CM | POA: Diagnosis present

## 2020-05-25 DIAGNOSIS — Z87891 Personal history of nicotine dependence: Secondary | ICD-10-CM | POA: Insufficient documentation

## 2020-05-25 DIAGNOSIS — G8929 Other chronic pain: Secondary | ICD-10-CM | POA: Insufficient documentation

## 2020-05-25 LAB — COMPREHENSIVE METABOLIC PANEL
ALT: 14 U/L (ref 0–44)
AST: 15 U/L (ref 15–41)
Albumin: 4.3 g/dL (ref 3.5–5.0)
Alkaline Phosphatase: 59 U/L (ref 38–126)
Anion gap: 9 (ref 5–15)
BUN: 13 mg/dL (ref 6–20)
CO2: 26 mmol/L (ref 22–32)
Calcium: 9 mg/dL (ref 8.9–10.3)
Chloride: 102 mmol/L (ref 98–111)
Creatinine, Ser: 1.17 mg/dL (ref 0.61–1.24)
GFR, Estimated: >60 mL/min (ref >60)
Glucose, Bld: 107 mg/dL — ABNORMAL HIGH (ref 70–99)
Potassium: 3.9 mmol/L (ref 3.5–5.1)
Sodium: 137 mmol/L (ref 135–145)
Total Bilirubin: 1.1 mg/dL (ref 0.3–1.2)
Total Protein: 7.3 g/dL (ref 6.5–8.1)

## 2020-05-25 LAB — CBC
HCT: 45.3 % (ref 39.0–52.0)
Hemoglobin: 15.1 g/dL (ref 13.0–17.0)
MCH: 29.3 pg (ref 26.0–34.0)
MCHC: 33.3 g/dL (ref 30.0–36.0)
MCV: 87.8 fL (ref 80.0–100.0)
Platelets: 228 10*3/uL (ref 150–400)
RBC: 5.16 MIL/uL (ref 4.22–5.81)
RDW: 12.9 % (ref 11.5–15.5)
WBC: 5.1 10*3/uL (ref 4.0–10.5)
nRBC: 0 % (ref 0.0–0.2)

## 2020-05-25 LAB — URINALYSIS, COMPLETE (UACMP) WITH MICROSCOPIC
Bacteria, UA: NONE SEEN
Bilirubin Urine: NEGATIVE
Glucose, UA: NEGATIVE mg/dL
Hgb urine dipstick: NEGATIVE
Ketones, ur: NEGATIVE mg/dL
Leukocytes,Ua: NEGATIVE
Nitrite: NEGATIVE
Protein, ur: NEGATIVE mg/dL
Specific Gravity, Urine: 1.013 (ref 1.005–1.030)
pH: 6 (ref 5.0–8.0)

## 2020-05-25 LAB — LIPASE, BLOOD: Lipase: 28 U/L (ref 11–51)

## 2020-05-25 MED ORDER — KETOROLAC TROMETHAMINE 30 MG/ML IJ SOLN
15.0000 mg | Freq: Once | INTRAMUSCULAR | Status: AC
  Administered 2020-05-25: 15 mg via INTRAVENOUS
  Filled 2020-05-25: qty 1

## 2020-05-25 MED ORDER — LACTATED RINGERS IV BOLUS
1000.0000 mL | Freq: Once | INTRAVENOUS | Status: AC
  Administered 2020-05-25: 1000 mL via INTRAVENOUS

## 2020-05-25 MED ORDER — IOHEXOL 300 MG/ML  SOLN
100.0000 mL | Freq: Once | INTRAMUSCULAR | Status: AC | PRN
  Administered 2020-05-25: 100 mL via INTRAVENOUS

## 2020-05-25 MED ORDER — ONDANSETRON HCL 4 MG/2ML IJ SOLN
4.0000 mg | Freq: Once | INTRAMUSCULAR | Status: AC
  Administered 2020-05-25: 4 mg via INTRAVENOUS
  Filled 2020-05-25: qty 2

## 2020-05-25 NOTE — ED Provider Notes (Signed)
[facility] Emergency Department Provider Note   ____________________________________________   Event Date/Time   First MD Initiated Contact with Patient 05/25/20 1318     (approximate)  I have reviewed the triage vital signs and the nursing notes.   HISTORY  Chief Complaint Abdominal Pain    HPI Tony [name] is a 29 y.o. male with past medical history of pseudoseizures, complex migraines, and inflammatory bowel disease who presents to the ED complaining of abdominal pain.  Patient reports he has been dealing with abdominal pain for multiple months, ever since he had surgery on a right inguinal hernia back in February.  He states it primarily affects his umbilical area but recently has been moving down towards the right lower quadrant of his abdomen.  He describes it as sharp and constant, not exacerbated or alleviated by anything.  He states he has been eating or drinking normally, but will occasionally get nauseous.  He denies any vomiting, does endorse diarrhea but denies blood in his stool.  He has not had any fevers, dysuria, hematuria, cough, chest pain, or shortness of breath.  He was seen by his surgeon 10 days ago, who recommended he get a CT scan for recurrent hernia, but he has not been able to schedule this yet.        Past Medical History:  Diagnosis Date  . Anemia    vitamin d and b12 deficiency  . Colitis   . Headache    multiple/day.  . Seizures ([place])    seizure like activity.  has not had any symptoms since Topamax (march 2020)  . Tubular adenoma of colon    had polypectomy     Patient Active Problem List   Diagnosis Date Noted  . Complicated migraine 62/69/4854  . Acute left-sided weakness 03/18/2020  . Change in bowel function   . Seizure-like activity ([place]) 02/08/2019  . Inflammatory bowel disease 02/08/2019  . Back muscle spasm 02/08/2019  . Depression 02/08/2019    Past Surgical History:  Procedure Laterality Date  .  COLONOSCOPY WITH PROPOFOL N/A 03/08/2019   Procedure: COLONOSCOPY WITH PROPOFOL;  Surgeon: Lucilla Lame, MD;  Location: [place];  Service: Endoscopy;  Laterality: N/A;  . HERNIA REPAIR    . INGUINAL HERNIA REPAIR Right 07/30/2019   Procedure: LAPAROSCOPIC INGUINAL HERNIA;  Surgeon: Olean Ree, MD;  Location: [facility];  Service: General;  Laterality: Right;  . LIPOMA EXCISION  2019   lower right back  . POLYPECTOMY  03/08/2019   Procedure: POLYPECTOMY;  Surgeon: Lucilla Lame, MD;  Location: [place];  Service: Endoscopy;;  . TONSILLECTOMY    . VASECTOMY  04/25/2019    Prior to Admission medications   Medication Sig Start Date End Date Taking? Authorizing Provider  DULoxetine (CYMBALTA) 30 MG capsule Take 30 mg by mouth daily.    [provider]  ibuprofen (ADVIL) 600 MG tablet Take 1 tablet (600 mg total) by mouth every 6 (six) hours as needed. 05/01/20   Melynda Ripple, MD  tiZANidine (ZANAFLEX) 4 MG tablet Take 1 tablet (4 mg total) by mouth every 8 (eight) hours as needed for muscle spasms. 05/01/20   Melynda Ripple, MD  traMADol (ULTRAM) 50 MG tablet Take 1 tablet (50 mg total) by mouth every 12 (twelve) hours as needed. 03/21/20   Duanne Guess, PA-C  vitamin B-12 (CYANOCOBALAMIN) 1000 MCG tablet Take 1 tablet (1,000 mcg total) by mouth daily. 03/19/20   Patrecia Pour, MD  Mesalamine (  ASACOL) 400 MG CPDR DR capsule Take 2 capsules (800 mg total) by mouth 3 (three) times daily. 03/14/19 07/08/19  Lucilla Lame, MD    Allergies Patient has no known allergies.  Family History  Problem Relation Age of Onset  . Crohn's disease Mother   . Healthy Father     Social History Social History   Tobacco Use  . Smoking status: Former Smoker    Years: 2.00    Types: Cigarettes    Quit date: 02/2017    Years since quitting: 3.3  . Smokeless tobacco: Never Used  . Tobacco comment: 1 pack per month  Vaping Use  . Vaping Use: Former  . Start date:  08/04/2016  . Quit date: 02/04/2017  Substance Use Topics  . Alcohol use: Yes    Alcohol/week: 3.0 standard drinks    Types: 3 Cans of beer per week    Comment:    . Drug use: Never    Review of Systems  Constitutional: No fever/chills Eyes: No visual changes. ENT: No sore throat. Cardiovascular: Denies chest pain. Respiratory: Denies shortness of breath. Gastrointestinal: Positive for abdominal pain and nausea, no vomiting.  Positive for diarrhea.  No constipation. Genitourinary: Negative for dysuria. Musculoskeletal: Negative for back pain. Skin: Negative for rash. Neurological: Negative for headaches, focal weakness or numbness.  ____________________________________________   PHYSICAL EXAM:  VITAL SIGNS: ED Triage Vitals  Enc Vitals Group     BP 05/25/20 0739 (!) 134/92     Pulse Rate 05/25/20 0739 74     Resp 05/25/20 0739 20     Temp 05/25/20 0739 98.4 F (36.9 C)     Temp Source 05/25/20 0739 Oral     SpO2 05/25/20 0739 100 %     Weight 05/25/20 0740 200 lb (90.7 kg)     Height 05/25/20 0740 5\' 10"  (1.778 m)     Head Circumference --      Peak Flow --      Pain Score 05/25/20 0742 7     Pain Loc --      Pain Edu? --      Excl. in [place]? --     Constitutional: Alert and oriented. Eyes: Conjunctivae are normal. Head: Atraumatic. Nose: No congestion/rhinnorhea. Mouth/Throat: Mucous membranes are moist. Neck: Normal ROM Cardiovascular: Normal rate, regular rhythm. Grossly normal heart sounds. Respiratory: Normal respiratory effort.  No retractions. Lungs CTAB. Gastrointestinal: Soft and diffusely tender to palpation with no rebound or guarding.  No obvious inguinal or umbilical hernias noted. No distention. Genitourinary: deferred Musculoskeletal: No lower extremity tenderness nor edema. Neurologic:  Normal speech and language. No gross focal neurologic deficits are appreciated. Skin:  Skin is warm, dry and intact. No rash noted. Psychiatric: Mood and affect  are normal. Speech and behavior are normal.  ____________________________________________   LABS (all labs ordered are listed, but only abnormal results are displayed)  Labs Reviewed  COMPREHENSIVE METABOLIC PANEL - Abnormal; Notable for the following components:      Result Value   Glucose, Bld 107 (*)    All other components within normal limits  URINALYSIS, COMPLETE (UACMP) WITH MICROSCOPIC - Abnormal; Notable for the following components:   Color, Urine YELLOW (*)    APPearance HAZY (*)    All other components within normal limits  LIPASE, BLOOD  CBC    PROCEDURES  Procedure(s) performed (including Critical Care):  Procedures   ____________________________________________   INITIAL IMPRESSION / ASSESSMENT AND PLAN / ED COURSE  29 year old male with past medical history of pseudoseizures, complex migraines, and inguinal hernia status post repair who presents to the ED complaining of recurrent abdominal pain over the past few months.  Due to concern for recurrent hernia, we will further assess with CT scan.  Labs thus far unremarkable, UA with no evidence of infection.  There is no obvious hernia on exam and I have low suspicion for strangulated or incarcerated hernia.  We will treat with IV Toradol and Zofran, reassess.  CT scan is negative for acute process, does show small fat-containing umbilical hernia but this is unlikely to be causing his pain.  Patient reports feeling better following Toradol and Zofran, at this point he is appropriate for discharge home with outpatient follow-up.  He states he has GI appointment scheduled for January and he was also counseled to follow-up with general surgery.  Patient counseled to return to the ED for new worsening symptoms, patient agrees with plan.      ____________________________________________   FINAL CLINICAL IMPRESSION(S) / ED DIAGNOSES  Final diagnoses:  Chronic abdominal pain     ED Discharge Orders     None       Note:  This document was prepared using Dragon voice recognition software and may include unintentional dictation errors.   Blake Divine, MD 05/25/20 1515

## 2020-05-25 NOTE — ED Triage Notes (Signed)
Pt to ED via POV with c/o umbilical abdominal pain, states feels some bulging around his belly button, pt states hx of inguinal hernia, pt states Dr. Hampton Abbot scheduled outpatient US/CT for possible umbilical hernia for 25/27 however pain is worsening.

## 2020-05-25 NOTE — ED Notes (Signed)
See triage note, pt reports lower abdominal pain that has been worsening since surgery in January. Hx hernia. Recent heavy lifting. +nausea.  No swelling noted at this time .

## 2020-05-27 ENCOUNTER — Ambulatory Visit
Admission: EM | Admit: 2020-05-27 | Discharge: 2020-05-27 | Disposition: A | Discharge status: Home / Self Care | Payer: 59 | Attending: Sports Medicine | Admitting: Sports Medicine

## 2020-05-27 ENCOUNTER — Encounter: Payer: Self-pay | Admitting: Emergency Medicine

## 2020-05-27 ENCOUNTER — Other Ambulatory Visit: Payer: Self-pay

## 2020-05-27 DIAGNOSIS — K649 Unspecified hemorrhoids: Secondary | ICD-10-CM

## 2020-05-27 DIAGNOSIS — R1084 Generalized abdominal pain: Secondary | ICD-10-CM

## 2020-05-27 DIAGNOSIS — K625 Hemorrhage of anus and rectum: Secondary | ICD-10-CM

## 2020-05-27 NOTE — Discharge Instructions (Signed)
I reassured him that I did not feel as though there was anything to worry about today.  He was just seen in the ER had a normal CT scan and is set up with his GI doctor for early January.  His exam as well as his vital signs are reassuring.  I did not feel the need to get any labs today. We will give him information about a high-fiber diet. Discussed the red flag signs and symptoms and certainly if he has any further blood loss, feels lightheaded, or has a true syncopal episode and he should seek out immediate care in an emergency room setting. he will follow up with [place] as needed.

## 2020-05-27 NOTE — ED Provider Notes (Signed)
[facility]    CSN: [id number] Arrival date & time: 05/27/20  1416      History   Chief Complaint Chief Complaint  Patient presents with  . Abdominal Pain  . Rectal Bleeding    HPI Tony [name] is a 29 y.o. male.   Pleasant 29 year old male who presents for evaluation of chronic abdominal pain and some blood in his stool. He has a past medical history of pseudoseizures, and complex migraines.  Patient reports he has been dealing with abdominal pain for multiple months, ever since he had surgery on a right inguinal hernia back in February.  He states it primarily affects his umbilical area but recently has been moving down towards the right lower quadrant of his abdomen.  He describes it as sharp and constant, not exacerbated or alleviated by anything.  He states he has been eating or drinking normally, but will occasionally get nauseous.  He denies any vomiting, does endorse diarrhea and up until this morning denies blood in his stool.  He has not had any fevers, dysuria, hematuria, cough, chest pain, or shortness of breath.  He was seen by his surgeon 2 weeks ago, who recommended he get a CT scan for recurrent hernia, and this was completed 3 days ago and was reassuring.  Official read is in the chart.  Patient was seen 3 days ago in the emergency room and had a CT scan done.  There was concern about a possible issue with hernia.  CT scan was reassuring.  He is set up with GI and is scheduled to see them in early January.  He has had a colonoscopy done in the past and he reports he had some polyps removed.  There was a question on review of his chart whether or not he had Crohn's disease, but he says that he does not have it but his mom has it.  On further history it appears as though he has been straining a lot more than normal and his stool is somewhat hard.  He noticed some blood on the toilet paper this morning.  He was concerned and comes in today.  Denies any  lightheadedness or presyncopal or syncopal episodes.     Past Medical History:  Diagnosis Date  . Anemia    vitamin d and b12 deficiency  . Colitis   . Headache    multiple/day.  . Seizures ([place])    seizure like activity.  has not had any symptoms since Topamax (march 2020)  . Tubular adenoma of colon    had polypectomy     Patient Active Problem List   Diagnosis Date Noted  . Complicated migraine [id number]  . Acute left-sided weakness 03/18/2020  . Change in bowel function   . Seizure-like activity ([place]) 02/08/2019  . Inflammatory bowel disease 02/08/2019  . Back muscle spasm 02/08/2019  . Depression 02/08/2019    Past Surgical History:  Procedure Laterality Date  . COLONOSCOPY WITH PROPOFOL N/A 03/08/2019   Procedure: COLONOSCOPY WITH PROPOFOL;  Surgeon: Lucilla Lame, MD;  Location: [place];  Service: Endoscopy;  Laterality: N/A;  . HERNIA REPAIR    . INGUINAL HERNIA REPAIR Right 07/30/2019   Procedure: LAPAROSCOPIC INGUINAL HERNIA;  Surgeon: Olean Ree, MD;  Location: [facility];  Service: General;  Laterality: Right;  . LIPOMA EXCISION  2019   lower right back  . POLYPECTOMY  03/08/2019   Procedure: POLYPECTOMY;  Surgeon: Lucilla Lame, MD;  Location: [place];  Service: Endoscopy;;  .  TONSILLECTOMY    . VASECTOMY  04/25/2019       Home Medications    Prior to Admission medications   Medication Sig Start Date End Date Taking? Authorizing Provider  DULoxetine (CYMBALTA) 30 MG capsule Take 30 mg by mouth daily.   Yes [provider]  tiZANidine (ZANAFLEX) 4 MG tablet Take 1 tablet (4 mg total) by mouth every 8 (eight) hours as needed for muscle spasms. 05/01/20  Yes Melynda Ripple, MD  traMADol (ULTRAM) 50 MG tablet Take 1 tablet (50 mg total) by mouth every 12 (twelve) hours as needed. 03/21/20  Yes Duanne Guess, PA-C  ibuprofen (ADVIL) 600 MG tablet Take 1 tablet (600 mg total) by mouth every 6 (six) hours as needed.  05/01/20   Melynda Ripple, MD  vitamin B-12 (CYANOCOBALAMIN) 1000 MCG tablet Take 1 tablet (1,000 mcg total) by mouth daily. 03/19/20   Patrecia Pour, MD  Mesalamine (ASACOL) 400 MG CPDR DR capsule Take 2 capsules (800 mg total) by mouth 3 (three) times daily. 03/14/19 07/08/19  Lucilla Lame, MD    Family History Family History  Problem Relation Age of Onset  . Crohn's disease Mother   . Healthy Father     Social History Social History   Tobacco Use  . Smoking status: Former Smoker    Years: 2.00    Types: Cigarettes    Quit date: 02/2017    Years since quitting: 3.3  . Smokeless tobacco: Never Used  . Tobacco comment: 1 pack per month  Vaping Use  . Vaping Use: Former  . Start date: 08/04/2016  . Quit date: 02/04/2017  Substance Use Topics  . Alcohol use: Yes    Alcohol/week: 3.0 standard drinks    Types: 3 Cans of beer per week    Comment:    . Drug use: Never     Allergies   Patient has no known allergies.   Review of Systems Review of Systems  Constitutional: Negative for activity change, appetite change, chills, diaphoresis, fatigue and fever.  HENT: Negative.   Eyes: Negative.   Respiratory: Negative.   Cardiovascular: Negative for chest pain.  Gastrointestinal: Positive for abdominal pain, blood in stool, constipation, diarrhea and nausea. Negative for abdominal distention, rectal pain and vomiting.  Genitourinary: Negative for dysuria.  Skin: Negative for color change, pallor, rash and wound.  All other systems reviewed and are negative.    Physical Exam Triage Vital Signs ED Triage Vitals  Enc Vitals Group     BP 05/27/20 1450 102/73     Pulse Rate 05/27/20 1450 60     Resp 05/27/20 1450 18     Temp 05/27/20 1450 98.7 F (37.1 C)     Temp Source 05/27/20 1450 Oral     SpO2 05/27/20 1450 100 %     Weight 05/27/20 1448 200 lb (90.7 kg)     Height 05/27/20 1448 5\' 10"  (1.778 m)     Head Circumference --      Peak Flow --      Pain Score 05/27/20  1448 7     Pain Loc --      Pain Edu? --      Excl. in [place]? --    No data found.  Updated Vital Signs BP 102/73 (BP Location: Right Arm)   Pulse 60   Temp 98.7 F (37.1 C) (Oral)   Resp 18   Ht 5\' 10"  (1.778 m)   Wt 90.7 kg   SpO2  100%   BMI 28.70 kg/m   Visual Acuity Right Eye Distance:   Left Eye Distance:   Bilateral Distance:    Right Eye Near:   Left Eye Near:    Bilateral Near:     Physical Exam Vitals and nursing note reviewed.  Constitutional:      General: He is not in acute distress.    Appearance: He is well-developed. He is not ill-appearing or toxic-appearing.  HENT:     Head: Normocephalic and atraumatic.  Cardiovascular:     Rate and Rhythm: Normal rate and regular rhythm.     Heart sounds: Normal heart sounds. No murmur heard. No friction rub. No gallop.      Comments: Not tachycardic Pulmonary:     Effort: Pulmonary effort is normal.     Breath sounds: Normal breath sounds.  Abdominal:     General: Bowel sounds are normal. There is no distension.     Palpations: Abdomen is soft. There is no shifting dullness, fluid wave, hepatomegaly, splenomegaly, mass or pulsatile mass.     Tenderness: There is generalized abdominal tenderness. There is no right CVA tenderness, left CVA tenderness, guarding or rebound. Negative signs include Murphy's sign, Rovsing's sign, McBurney's sign and psoas sign.     Comments: Abdomen is soft.  There is some tenderness that is global.  There is no rebound or guarding.  Genitourinary:    Rectum: Normal. Guaiac result positive. No mass.     Comments: No fissures or blood seen around the anus or rectal vault. Skin:    General: Skin is warm and dry.     Capillary Refill: Capillary refill takes less than 2 seconds.  Neurological:     Mental Status: He is alert.      UC Treatments / Results  Labs (all labs ordered are listed, but only abnormal results are displayed) Labs Reviewed - No data to  display  EKG   Radiology No results found.   CT ABDOMEN AND PELVIS WITH CONTRAST done on 05/25/2020  TECHNIQUE: Multidetector CT imaging of the abdomen and pelvis was performed using the standard protocol following bolus administration of intravenous contrast.  CONTRAST:  157mL OMNIPAQUE IOHEXOL 300 MG/ML  SOLN  COMPARISON:  10/15/2019 and 07/19/2019  FINDINGS: Lower chest: No acute abnormality.  Hepatobiliary: Lateral right hepatic lobe low-attenuation structure measures 1.2 cm, image 18/2. This measured the same on 07/19/2019. Stable 6 mm segment 5 low-density structure, image 27/2. 7 mm low density structure in segment 2 is also unchanged, image 14/2.  Gallbladder is within normal limits.  Pancreas: Unremarkable. No pancreatic ductal dilatation or surrounding inflammatory changes.  Spleen: Normal in size without focal abnormality.  Adrenals/Urinary Tract: Normal adrenal glands. No kidney mass or hydronephrosis identified. Urinary bladder is unremarkable.  Stomach/Bowel: Stomach appears normal. The appendix is visualized and is unremarkable, image 68/2. No bowel wall thickening, inflammation, or distension.  Vascular/Lymphatic: No significant vascular findings are present. No enlarged abdominal or pelvic lymph nodes.  Reproductive: Prostate is unremarkable.  Other: No free fluid or fluid collection. Small fat containing umbilical hernia is identified measuring 1.2 x 1.2 cm, image 48/2.  Musculoskeletal: No acute or significant osseous findings.  IMPRESSION: 1. No acute findings identified within the abdomen or pelvis. 2. Small fat containing umbilical hernia. 3. Three indeterminate hypodense lesions within both lobes of liver which are not significantly changed compared with 07/19/2019. Stability favors of benign process. More definitive characterization could be obtained with contrast enhanced MRI of the liver  without and with contrast  material.  Procedures Procedures (including critical care time)  Medications Ordered in UC Medications - No data to display  Initial Impression / Assessment and Plan / UC Course  I have reviewed the triage vital signs and the nursing notes.  Pertinent labs & imaging results that were available during my care of the patient were reviewed by me and considered in my medical decision making (see chart for details).   Clinical impression:  Chronic abdominal pain being followed by GI.  Has a scheduled appointment in January.  Recently seen in the emergency room and had a CT scan that was essentially normal.  Results are above.  Patient presents today with concern about having some blood on the toilet paper.  Seems consistent with an internal hemorrhoid that got irritated from straining.  Treatment plan: 1.  The findings and treat plan were discussed in detail with the patient.  Patient was in agreement. 2.  I reassured him that I did not feel as though there was anything to worry about today.  He was just seen in the ER had a normal CT scan and is set up with his GI doctor for early January.  His exam as well as his vital signs are reassuring.  I did not feel the need to get any labs today. 3.  We will give him information about a high-fiber diet. 4.  Discussed the red flag signs and symptoms and certainly if he has any further blood loss, feels lightheaded, or has a true syncopal episode and he should seek out immediate care in an emergency room setting. 5 he will follow up with [place] as needed.   Final Clinical Impressions(s) / UC Diagnoses   Final diagnoses:  Rectal bleeding  Hemorrhoids, unspecified hemorrhoid type  Generalized abdominal pain     Discharge Instructions     I reassured him that I did not feel as though there was anything to worry about today.  He was just seen in the ER had a normal CT scan and is set up with his GI doctor for early January.  His exam as well as his  vital signs are reassuring.  I did not feel the need to get any labs today. We will give him information about a high-fiber diet. Discussed the red flag signs and symptoms and certainly if he has any further blood loss, feels lightheaded, or has a true syncopal episode and he should seek out immediate care in an emergency room setting. he will follow up with [place] as needed.     ED Prescriptions    None     PDMP not reviewed this encounter.   Verda Cumins, MD 05/27/20 [phone]

## 2020-05-27 NOTE — ED Triage Notes (Signed)
Patient c/o abdominal pain that started in August. He states he noticed blood in his stool this morning.

## 2020-05-28 ENCOUNTER — Ambulatory Visit: Admission: RE | Admit: 2020-05-28 | Payer: 59 | Source: Ambulatory Visit

## 2020-06-10 DIAGNOSIS — U071 COVID-19: Secondary | ICD-10-CM

## 2020-06-10 HISTORY — DX: COVID-19: U07.1

## 2020-06-17 ENCOUNTER — Ambulatory Visit: Payer: 59 | Admitting: Gastroenterology

## 2020-06-26 ENCOUNTER — Encounter: Payer: Self-pay | Admitting: Internal Medicine

## 2020-07-20 ENCOUNTER — Ambulatory Visit (INDEPENDENT_AMBULATORY_CARE_PROVIDER_SITE_OTHER): Payer: 59 | Admitting: Gastroenterology

## 2020-07-20 ENCOUNTER — Other Ambulatory Visit: Payer: Self-pay

## 2020-07-20 ENCOUNTER — Encounter: Payer: Self-pay | Admitting: Gastroenterology

## 2020-07-20 VITALS — BP 112/73 | HR 72 | Temp 98.0°F | Ht 70.0 in | Wt 209.2 lb

## 2020-07-20 DIAGNOSIS — R197 Diarrhea, unspecified: Secondary | ICD-10-CM

## 2020-07-20 NOTE — Progress Notes (Signed)
Primary Care Physician: Glean Hess, MD  Primary Gastroenterologist:  Dr. Lucilla Lame  Chief Complaint  Patient presents with  . Change in bowel habits    HPI: Tony [name] is a 30 y.o. male here with a history of a colonoscopy with inflammation seen in the terminal ileum.  The biopsy showed:    This was done in October 2020.  The patient was recommended to have his first-degree relatives set up for a colonoscopy due to his finding of a tubular adenoma.The patient had negative celiac sprue antibodies and a genetic panel that was negative in the past.  The patient reports that he has been having 4 months of diarrhea with the diarrhea being 3 times a day and he states that he usually wakes him up somewhere between 3 and 4:00 in the morning.  He denies any bloody stools but does report that he had some rectal bleeding and was seen at urgent care who told him that it was from internal hemorrhoids.  The patient does have some right-sided abdominal pain but is not sure if it is from his intestines or from his hernia repair surgery that was done robotically.  Past Medical History:  Diagnosis Date  . Anemia    vitamin d and b12 deficiency  . Colitis   . Headache    multiple/day.  . Seizures ([place])    seizure like activity.  has not had any symptoms since Topamax (march 2020)  . Tubular adenoma of colon    had polypectomy     Current Outpatient Medications  Medication Sig Dispense Refill  . albuterol (VENTOLIN HFA) 108 (90 Base) MCG/ACT inhaler Inhale 2 puffs into the lungs every 6 (six) hours as needed.    . busPIRone (BUSPAR) 15 MG tablet PLEASE SEE ATTACHED FOR DETAILED DIRECTIONS    . DULoxetine (CYMBALTA) 30 MG capsule Take 30 mg by mouth daily.    Marland Kitchen ibuprofen (ADVIL) 600 MG tablet Take 1 tablet (600 mg total) by mouth every 6 (six) hours as needed. 30 tablet 0  . LORazepam (ATIVAN) 0.5 MG tablet Take by mouth.    . vitamin B-12 (CYANOCOBALAMIN) 1000 MCG tablet Take 1  tablet (1,000 mcg total) by mouth daily. 30 tablet 2   No current facility-administered medications for this visit.    Allergies as of 07/20/2020  . (No Known Allergies)    ROS:  General: Negative for anorexia, weight loss, fever, chills, fatigue, weakness. ENT: Negative for hoarseness, difficulty swallowing , nasal congestion. CV: Negative for chest pain, angina, palpitations, dyspnea on exertion, peripheral edema.  Respiratory: Negative for dyspnea at rest, dyspnea on exertion, cough, sputum, wheezing.  GI: See history of present illness. GU:  Negative for dysuria, hematuria, urinary incontinence, urinary frequency, nocturnal urination.  Endo: Negative for unusual weight change.    Physical Examination:   BP 112/73   Pulse 72   Temp 98 F (36.7 C) (Temporal)   Ht 5\' 10"  (1.778 m)   Wt 209 lb 3.2 oz (94.9 kg)   BMI 30.02 kg/m   General: Well-nourished, well-developed in no acute distress.  Eyes: No icterus. Conjunctivae pink. Lungs: Clear to auscultation bilaterally. Non-labored. Heart: Regular rate and rhythm, no murmurs rubs or gallops.  Abdomen: Bowel sounds are normal, nontender, nondistended, no hepatosplenomegaly or masses, no abdominal bruits or hernia , no rebound or guarding.   Extremities: No lower extremity edema. No clubbing or deformities. Neuro: Alert and oriented x 3.  Grossly intact. Skin: Warm and  dry, no jaundice.   Psych: Alert and cooperative, normal mood and affect.  Labs:    Imaging Studies: No results found.  Assessment and Plan:   Tony [name] is a 30 y.o. y/o male who comes in with a history of mildly active ileitis with the possibilities of NSAIDs versus early inflammatory bowel disease.  The patient had denied any NSAIDs.  The patient will have his stool sent off for fecal calprotectin and will have his blood sent off for ASCA.  If these are positive the patient may need to be sent to Dr. Marius Ditch for further evaluation and treatment.  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  He is now working again.  The patient has been explained the plan and agrees with it.     Lucilla Lame, MD. [name]    Note: This dictation was prepared with Dragon dictation along with smaller phrase technology. Any transcriptional errors that result from this process are unintentional.

## 2020-07-22 LAB — SACCHAROMYCES CEREVISIAE ANTIBODIES, IGG AND IGA
Saccharomyces cerevisiae, IgA: <20 Units (ref 0.0–24.9)
Saccharomyces cerevisiae, IgG: 23.4 Units (ref 0.0–24.9)

## 2020-08-04 ENCOUNTER — Telehealth: Payer: Self-pay

## 2020-08-04 NOTE — Telephone Encounter (Signed)
Mychart message sent to pt inquiring about stool test Dr Allen Norris has ordered.

## 2020-08-04 NOTE — Telephone Encounter (Signed)
-----   Message from Lucilla Lame, MD sent at 07/29/2020  8:08 AM EST ----- Let the patient know I am still waiting for the stool samples to make a informed decision on further steps for possible inflammatory bowel disease

## 2020-09-17 ENCOUNTER — Other Ambulatory Visit: Payer: Self-pay | Admitting: Gastroenterology

## 2020-09-19 LAB — CALPROTECTIN, FECAL: Calprotectin, Fecal: 214 ug/g — ABNORMAL HIGH (ref 0–120)

## 2020-09-21 ENCOUNTER — Telehealth: Payer: Self-pay

## 2020-09-21 NOTE — Telephone Encounter (Signed)
-----   Message from Lucilla Lame, MD sent at 09/20/2020  6:01 PM EDT ----- Have this patient seen by Dr. Marius Ditch for possible IBD

## 2020-09-21 NOTE — Telephone Encounter (Signed)
Pt notified of lab results and scheduled follow up appt with Dr. Marius Ditch on 09/24/20

## 2020-09-22 ENCOUNTER — Other Ambulatory Visit: Payer: Self-pay

## 2020-09-22 ENCOUNTER — Ambulatory Visit
Admission: RE | Admit: 2020-09-22 | Discharge: 2020-09-22 | Disposition: A | Discharge status: Home / Self Care | Payer: 59 | Source: Ambulatory Visit | Attending: Emergency Medicine | Admitting: Emergency Medicine

## 2020-09-22 ENCOUNTER — Ambulatory Visit (INDEPENDENT_AMBULATORY_CARE_PROVIDER_SITE_OTHER): Payer: 59

## 2020-09-22 VITALS — BP 118/81 | HR 89 | Temp 98.4°F | Resp 17

## 2020-09-22 DIAGNOSIS — M25571 Pain in right ankle and joints of right foot: Secondary | ICD-10-CM | POA: Diagnosis not present

## 2020-09-22 MED ORDER — IBUPROFEN 800 MG PO TABS: 800.0000 mg | ORAL_TABLET | Freq: Three times a day (TID) | ORAL | 0 refills | Status: DC | PRN

## 2020-09-22 NOTE — Discharge Instructions (Addendum)
Take the ibuprofen as prescribed.  Rest and elevate your ankle.  Apply ice packs 2-3 times a day for up to 20 minutes each.  Wear the ankle splint as needed for comfort.    Follow up with an orthopedist such as the one listed below.

## 2020-09-22 NOTE — ED Provider Notes (Signed)
[facility]    CSN: [id number] Arrival date & time: 09/22/20  1051      History   Chief Complaint Chief Complaint  Patient presents with  . Ankle Pain    HPI Tony [name] is a 30 y.o. male.   Patient presents with 3-day history of right ankle pain and swelling.  The pain started when his daughter fell on his ankle.  The pain is worse with weightbearing and ambulation; improves with rest.  Treatment attempted at home with Tylenol, ibuprofen, ice packs.  He denies numbness, weakness, paresthesias, wounds, redness, bruising, or other symptoms.  Patient states he has remote history of torn ligaments in his right ankle from bull riding; this occurred in [place].  His medical history includes seizures, headache, anemia, IBS, colitis, adenoma of colon, back muscle spasm, depression.  The history is provided by the patient and medical records.    Past Medical History:  Diagnosis Date  . Anemia    vitamin d and b12 deficiency  . Colitis   . Headache    multiple/day.  . Seizures ([place])    seizure like activity.  has not had any symptoms since Topamax (march 2020)  . Tubular adenoma of colon    had polypectomy     Patient Active Problem List   Diagnosis Date Noted  . Complicated migraine 63/84/5364  . Acute left-sided weakness 03/18/2020  . Change in bowel function   . Seizure-like activity ([place]) 02/08/2019  . Inflammatory bowel disease 02/08/2019  . Back muscle spasm 02/08/2019  . Depression 02/08/2019    Past Surgical History:  Procedure Laterality Date  . COLONOSCOPY WITH PROPOFOL N/A 03/08/2019   Procedure: COLONOSCOPY WITH PROPOFOL;  Surgeon: Lucilla Lame, MD;  Location: [facility];  Service: Endoscopy;  Laterality: N/A;  . HERNIA REPAIR    . INGUINAL HERNIA REPAIR Right 07/30/2019   Procedure: LAPAROSCOPIC INGUINAL HERNIA;  Surgeon: Olean Ree, MD;  Location: [facility];  Service: General;  Laterality: Right;  . LIPOMA EXCISION  2019   lower right  back  . POLYPECTOMY  03/08/2019   Procedure: POLYPECTOMY;  Surgeon: Lucilla Lame, MD;  Location: [facility];  Service: Endoscopy;;  . TONSILLECTOMY    . VASECTOMY  04/25/2019       Home Medications    Prior to Admission medications   Medication Sig Start Date End Date Taking? Authorizing Provider  busPIRone (BUSPAR) 15 MG tablet PLEASE SEE ATTACHED FOR DETAILED DIRECTIONS 07/02/20  Yes [provider]  DULoxetine (CYMBALTA) 30 MG capsule Take 30 mg by mouth daily.   Yes [provider]  ibuprofen (ADVIL) 800 MG tablet Take 1 tablet (800 mg total) by mouth every 8 (eight) hours as needed. 09/22/20  Yes Sharion Balloon, NP  albuterol (VENTOLIN HFA) 108 (90 Base) MCG/ACT inhaler Inhale 2 puffs into the lungs every 6 (six) hours as needed. 06/29/20   [provider]  LORazepam (ATIVAN) 0.5 MG tablet Take by mouth. 05/28/20   [provider]  vitamin B-12 (CYANOCOBALAMIN) 1000 MCG tablet Take 1 tablet (1,000 mcg total) by mouth daily. 03/19/20   Patrecia Pour, MD  Mesalamine (ASACOL) 400 MG CPDR DR capsule Take 2 capsules (800 mg total) by mouth 3 (three) times daily. 03/14/19 07/08/19  Lucilla Lame, MD    Family History Family History  Problem Relation Age of Onset  . Crohn's disease Mother   . Healthy Father     Social History Social History   Tobacco Use  .  Smoking status: Former Smoker    Years: 2.00    Types: Cigarettes    Quit date: 02/2017    Years since quitting: 3.6  . Smokeless tobacco: Never Used  . Tobacco comment: 1 pack per month  Vaping Use  . Vaping Use: Former  . Start date: 08/04/2016  . Quit date: 02/04/2017  Substance Use Topics  . Alcohol use: Yes    Alcohol/week: 3.0 standard drinks    Types: 3 Cans of beer per week    Comment:    . Drug use: Never     Allergies   Patient has no known allergies.   Review of Systems Review of Systems  Constitutional: Negative for chills and fever.  HENT: Negative for ear pain  and sore throat.   Eyes: Negative for pain and visual disturbance.  Respiratory: Negative for cough and shortness of breath.   Cardiovascular: Negative for chest pain and palpitations.  Gastrointestinal: Negative for abdominal pain and vomiting.  Genitourinary: Negative for dysuria and hematuria.  Musculoskeletal: Positive for arthralgias. Negative for back pain.  Skin: Negative for color change and rash.  Neurological: Negative for syncope, weakness and numbness.  All other systems reviewed and are negative.    Physical Exam Triage Vital Signs ED Triage Vitals  Enc Vitals Group     BP      Pulse      Resp      Temp      Temp src      SpO2      Weight      Height      Head Circumference      Peak Flow      Pain Score      Pain Loc      Pain Edu?      Excl. in [place]?    No data found.  Updated Vital Signs BP 118/81 (BP Location: Left Arm)   Pulse 89   Temp 98.4 F (36.9 C) (Oral)   Resp 17   SpO2 97%   Visual Acuity Right Eye Distance:   Left Eye Distance:   Bilateral Distance:    Right Eye Near:   Left Eye Near:    Bilateral Near:     Physical Exam Vitals and nursing note reviewed.  Constitutional:      General: He is not in acute distress.    Appearance: He is well-developed. He is not ill-appearing.  HENT:     Head: Normocephalic and atraumatic.     Mouth/Throat:     Mouth: Mucous membranes are moist.  Eyes:     Conjunctiva/sclera: Conjunctivae normal.  Cardiovascular:     Rate and Rhythm: Normal rate and regular rhythm.     Heart sounds: Normal heart sounds.  Pulmonary:     Effort: Pulmonary effort is normal. No respiratory distress.     Breath sounds: Normal breath sounds.  Abdominal:     Palpations: Abdomen is soft.     Tenderness: There is no abdominal tenderness.  Musculoskeletal:        General: Tenderness present. No swelling or deformity. Normal range of motion.     Cervical back: Neck supple.       Feet:  Skin:    General: Skin is  warm and dry.     Findings: No bruising, erythema, lesion or rash.  Neurological:     General: No focal deficit present.     Mental Status: He is alert and oriented to person, place,  and time.     Sensory: No sensory deficit.     Motor: No weakness.     Gait: Gait normal.  Psychiatric:        Mood and Affect: Mood normal.        Behavior: Behavior normal.      UC Treatments / Results  Labs (all labs ordered are listed, but only abnormal results are displayed) Labs Reviewed - No data to display  EKG   Radiology DG Ankle Complete Right  Result Date: 09/22/2020 CLINICAL DATA:  Right ankle pain for 3 days EXAM: RIGHT ANKLE - COMPLETE 3+ VIEW COMPARISON:  None. FINDINGS: There is no evidence of fracture, dislocation, or joint effusion. There is no evidence of arthropathy or other focal bone abnormality. Soft tissues are unremarkable. IMPRESSION: Negative. Electronically Signed   By: Davina Poke D.O.   On: 09/22/2020 11:35    Procedures Procedures (including critical care time)  Medications Ordered in UC Medications - No data to display  Initial Impression / Assessment and Plan / UC Course  I have reviewed the triage vital signs and the nursing notes.  Pertinent labs & imaging results that were available during my care of the patient were reviewed by me and considered in my medical decision making (see chart for details).   Right ankle pain.  X-ray negative.  Treating with ankle splint, ibuprofen, rest, elevation, ice packs.  Instructed patient to follow-up with orthopedics.  Contact information for Dr. Rudene Christians provided as he is on-call today.  Patient agrees to plan of care.   Final Clinical Impressions(s) / UC Diagnoses   Final diagnoses:  Acute right ankle pain     Discharge Instructions     Take the ibuprofen as prescribed.  Rest and elevate your ankle.  Apply ice packs 2-3 times a day for up to 20 minutes each.  Wear the ankle splint as needed for comfort.     Follow up with an orthopedist such as the one listed below.       ED Prescriptions    Medication Sig Dispense Auth. Provider   ibuprofen (ADVIL) 800 MG tablet Take 1 tablet (800 mg total) by mouth every 8 (eight) hours as needed. 21 tablet Sharion Balloon, NP     PDMP not reviewed this encounter.   Sharion Balloon, NP 09/22/20 1146

## 2020-09-22 NOTE — ED Triage Notes (Signed)
Patient c/o RT ankle pain x 3 days.   Patient states " my daughter fell on my ankle".   Patient endorses swelling and " some paleness".   Patient endorses increased pain with ambulation. Patient endorses decreased pain while sitting.   Patient endorses a history of injury in RT ankle during athletics.   Patient has used Ice, Tylenol, Elevation, and Ibuprofen with no relief of symptoms.

## 2020-09-24 ENCOUNTER — Other Ambulatory Visit: Payer: Self-pay

## 2020-09-24 ENCOUNTER — Encounter: Payer: Self-pay | Admitting: Gastroenterology

## 2020-09-24 ENCOUNTER — Ambulatory Visit: Payer: 59 | Admitting: Gastroenterology

## 2020-09-24 VITALS — BP 121/80 | HR 87 | Temp 97.9°F | Ht 70.0 in | Wt 217.4 lb

## 2020-09-24 DIAGNOSIS — R1013 Epigastric pain: Secondary | ICD-10-CM | POA: Diagnosis not present

## 2020-09-24 DIAGNOSIS — R197 Diarrhea, unspecified: Secondary | ICD-10-CM | POA: Diagnosis not present

## 2020-09-24 DIAGNOSIS — K529 Noninfective gastroenteritis and colitis, unspecified: Secondary | ICD-10-CM

## 2020-09-24 MED ORDER — NA SULFATE-K SULFATE-MG SULF 17.5-3.13-1.6 GM/177ML PO SOLN: 354.0000 mL | Freq: Once | ORAL | 0 refills | Status: AC

## 2020-09-24 NOTE — Progress Notes (Signed)
[name], MD [address]  [place]  [place], [place] [address]  Main: [phone]  Fax: [phone]    Gastroenterology Consultation  Referring Provider:     Glean Hess, MD Primary Care Physician:  Glean Hess, MD Primary Gastroenterologist:  Dr. Lucilla Lame Reason for Consultation:     Elevated fecal calprotectin levels, ileitis, loose stools        HPI:   Tony [name] is a 30 y.o. male referred by Dr. Army Melia, [name], MD  for consultation & management of chronic right lower quadrant pain associated with loose stools.  Patient was originally evaluated by Dr. Lucilla Lame for chronic diarrhea and right lower quadrant pain.  He underwent colonoscopy in 2020, found to have few aphthous ulcers in the terminal ileum, biopsies revealed active ileitis only, and colon biopsies were normal.  Apparently, patient was on Asacol in 2021.  Patient reports that his diarrhea and abdominal pain resolved after several months.  The symptoms have recurred since beginning of this year.  He has been taking ibuprofen about 3 times a day for last few days secondary to ankle sprain.  He denies any rectal bleeding.  He reports 1-2 loose bowel movements on average.  He reports bandlike sensation across his lower abdomen, worse in the right lower quadrant.  He does report nausea.  He denies any weight loss, loss of appetite.  He denies any joint pains.  His stool studies were negative in the past.  His fecal calprotectin levels are elevated to 214 most recently  Mother with Crohn's disease, underwent bowel resection  NSAIDs: Ibuprofen 800 mg 3 times daily for ankle pain  Antiplts/Anticoagulants/Anti thrombotics: None  GI Procedures:  Colonoscopy 03/08/2019 - Ileitis, rule out inflammatory bowel disease. Biopsied. - One 4 mm polyp in the ascending colon, removed with a cold biopsy forceps. Resected and retrieved. - Random biopsies were obtained in the entire colon.    Past  Medical History:  Diagnosis Date  . Anemia    vitamin d and b12 deficiency  . Colitis   . COVID-19 06/10/2020  . Headache    multiple/day.  . Seizures ([place])    seizure like activity.  has not had any symptoms since Topamax (march 2020)  . Tubular adenoma of colon    had polypectomy     Past Surgical History:  Procedure Laterality Date  . COLONOSCOPY WITH PROPOFOL N/A 03/08/2019   Procedure: COLONOSCOPY WITH PROPOFOL;  Surgeon: Lucilla Lame, MD;  Location: [facility];  Service: Endoscopy;  Laterality: N/A;  . HERNIA REPAIR    . INGUINAL HERNIA REPAIR Right 07/30/2019   Procedure: LAPAROSCOPIC INGUINAL HERNIA;  Surgeon: Olean Ree, MD;  Location: [facility];  Service: General;  Laterality: Right;  . LIPOMA EXCISION  2019   lower right back  . POLYPECTOMY  03/08/2019   Procedure: POLYPECTOMY;  Surgeon: Lucilla Lame, MD;  Location: [facility];  Service: Endoscopy;;  . TONSILLECTOMY    . VASECTOMY  04/25/2019    Current Outpatient Medications:  .  albuterol (VENTOLIN HFA) 108 (90 Base) MCG/ACT inhaler, Inhale 2 puffs into the lungs every 6 (six) hours as needed., Disp: , Rfl:  .  busPIRone (BUSPAR) 15 MG tablet, PLEASE SEE ATTACHED FOR DETAILED DIRECTIONS, Disp: , Rfl:  .  DULoxetine (CYMBALTA) 60 MG capsule, Take 60 mg by mouth daily., Disp: , Rfl:  .  gabapentin (NEURONTIN) 100 MG capsule, Take by mouth., Disp: , Rfl:  .  ibuprofen (  ADVIL) 800 MG tablet, Take 1 tablet (800 mg total) by mouth every 8 (eight) hours as needed., Disp: 21 tablet, Rfl: 0 .  LORazepam (ATIVAN) 0.5 MG tablet, Take by mouth., Disp: , Rfl:  .  Na Sulfate-K Sulfate-Mg Sulf 17.5-3.13-1.6 GM/177ML SOLN, Take 354 mLs by mouth once for 1 dose., Disp: 354 mL, Rfl: 0 .  prazosin (MINIPRESS) 2 MG capsule, Take 2 mg by mouth at bedtime., Disp: , Rfl:  .  vitamin B-12 (CYANOCOBALAMIN) 1000 MCG tablet, Take 1 tablet (1,000 mcg total) by mouth daily., Disp: 30 tablet, Rfl: 2   Family History  Problem  Relation Age of Onset  . Crohn's disease Mother   . Healthy Father      Social History   Tobacco Use  . Smoking status: Former Smoker    Years: 2.00    Types: Cigarettes    Quit date: 02/2017    Years since quitting: 3.6  . Smokeless tobacco: Never Used  . Tobacco comment: 1 pack per month  Vaping Use  . Vaping Use: Former  . Start date: 08/04/2016  . Quit date: 02/04/2017  Substance Use Topics  . Alcohol use: Yes    Alcohol/week: 3.0 standard drinks    Types: 3 Cans of beer per week    Comment:    . Drug use: Never    Allergies as of 09/24/2020  . (No Known Allergies)    Review of Systems:    All systems reviewed and negative except where noted in HPI.   Physical Exam:  BP 121/80 (BP Location: Left Arm, Patient Position: Sitting, Cuff Size: Normal)   Pulse 87   Temp 97.9 F (36.6 C) (Oral)   Ht 5\' 10"  (1.778 m)   Wt 217 lb 6 oz (98.6 kg)   BMI 31.19 kg/m  No LMP for male patient.  General:   Alert,  Well-developed, well-nourished, pleasant and cooperative in NAD Head:  Normocephalic and atraumatic. Eyes:  Sclera clear, no icterus.   Conjunctiva pink. Ears:  Normal auditory acuity. Nose:  No deformity, discharge, or lesions. Mouth:  No deformity or lesions,oropharynx pink & moist. Neck:  Supple; no masses or thyromegaly. Lungs:  Respirations even and unlabored.  Clear throughout to auscultation.   No wheezes, crackles, or rhonchi. No acute distress. Heart:  Regular rate and rhythm; no murmurs, clicks, rubs, or gallops. Abdomen:  Normal bowel sounds. Soft, epigastric tenderness as well as lower abdominal tenderness and non-distended without masses, hepatosplenomegaly or hernias noted.  No guarding or rebound tenderness.   Rectal: Not performed Msk:  Symmetrical without gross deformities. Good, equal movement & strength bilaterally. Pulses:  Normal pulses noted. Extremities:  No clubbing or edema.  No cyanosis. Neurologic:  Alert and oriented x3;  grossly normal  neurologically. Skin:  Intact without significant lesions or rashes. No jaundice. Lymph Nodes:  No significant cervical adenopathy. Psych:  Alert and cooperative. Normal mood and affect.  Imaging Studies: Reviewed  Assessment and Plan:   Tony [name] is a 30 y.o. Caucasian male with history of active ileitis based on colonoscopy in 2020, previously treated with Asacol is seen in consultation for recent flareup of diarrhea and abdominal pain, elevated fecal calprotectin levels Recommend stool studies to rule out infection Recommend upper endoscopy and colonoscopy Advised him to minimize intake of NSAIDs  Follow up in 2 months   [name], MD

## 2020-09-27 LAB — GI PROFILE, STOOL, PCR

## 2020-09-28 ENCOUNTER — Telehealth: Payer: Self-pay

## 2020-09-28 DIAGNOSIS — R197 Diarrhea, unspecified: Secondary | ICD-10-CM

## 2020-09-28 NOTE — Telephone Encounter (Signed)
Could you please inform the patient and ask him to collect the stool specimen appropriately  Thanks RV

## 2020-09-28 NOTE — Addendum Note (Signed)
Addended by: Ulyess Blossom L on: 09/28/2020 04:55 PM   Modules accepted: Orders

## 2020-09-28 NOTE — Telephone Encounter (Signed)
Called [facility] and they said the GI profile was not preformed because the patient over filled the tube

## 2020-09-28 NOTE — Telephone Encounter (Signed)
Patient states he does not know how to get it exactly right. Informed patient to ask the lab person that give him the kit to explain it better for him. He states he will come and pick up a new kit when he can

## 2020-09-28 NOTE — Telephone Encounter (Signed)
-----   Message from Lin Landsman, MD sent at 09/27/2020 10:07 PM EDT ----- Not sure why his stool test was cancelled Please check  Thanks RV

## 2020-09-30 NOTE — Discharge Instructions (Signed)

## 2020-10-01 ENCOUNTER — Ambulatory Visit
Admission: RE | Admit: 2020-10-01 | Discharge: 2020-10-01 | Disposition: A | Discharge status: Home / Self Care | Payer: 59 | Attending: Gastroenterology | Admitting: Gastroenterology

## 2020-10-01 ENCOUNTER — Other Ambulatory Visit: Payer: Self-pay

## 2020-10-01 ENCOUNTER — Encounter
Admission: RE | Disposition: A | Discharge status: Home / Self Care | Payer: Self-pay | Source: Home / Self Care | Attending: Gastroenterology

## 2020-10-01 ENCOUNTER — Ambulatory Visit: Payer: 59 | Admitting: Anesthesiology

## 2020-10-01 DIAGNOSIS — R1013 Epigastric pain: Secondary | ICD-10-CM

## 2020-10-01 DIAGNOSIS — K529 Noninfective gastroenteritis and colitis, unspecified: Secondary | ICD-10-CM | POA: Diagnosis not present

## 2020-10-01 DIAGNOSIS — Z8616 Personal history of COVID-19: Secondary | ICD-10-CM | POA: Diagnosis not present

## 2020-10-01 DIAGNOSIS — Z79899 Other long term (current) drug therapy: Secondary | ICD-10-CM | POA: Insufficient documentation

## 2020-10-01 DIAGNOSIS — R195 Other fecal abnormalities: Secondary | ICD-10-CM | POA: Insufficient documentation

## 2020-10-01 DIAGNOSIS — Z87891 Personal history of nicotine dependence: Secondary | ICD-10-CM | POA: Insufficient documentation

## 2020-10-01 DIAGNOSIS — K6389 Other specified diseases of intestine: Secondary | ICD-10-CM | POA: Diagnosis not present

## 2020-10-01 HISTORY — PX: COLONOSCOPY WITH PROPOFOL: SHX5780

## 2020-10-01 HISTORY — PX: ESOPHAGOGASTRODUODENOSCOPY (EGD) WITH PROPOFOL: SHX5813

## 2020-10-01 SURGERY — COLONOSCOPY WITH PROPOFOL
Anesthesia: General | Site: Rectum

## 2020-10-01 MED ORDER — PROPOFOL 500 MG/50ML IV EMUL
INTRAVENOUS | Status: DC | PRN
  Administered 2020-10-01: 160 ug/kg/min via INTRAVENOUS

## 2020-10-01 MED ORDER — LACTATED RINGERS IV SOLN: INTRAVENOUS | Status: DC

## 2020-10-01 MED ORDER — PROPOFOL 10 MG/ML IV BOLUS
INTRAVENOUS | Status: DC | PRN
  Administered 2020-10-01 (×3): 30 mg via INTRAVENOUS

## 2020-10-01 MED ORDER — LIDOCAINE HCL (CARDIAC) PF 100 MG/5ML IV SOSY
PREFILLED_SYRINGE | INTRAVENOUS | Status: DC | PRN
  Administered 2020-10-01: 40 mg via INTRAVENOUS

## 2020-10-01 MED ORDER — GLYCOPYRROLATE 0.2 MG/ML IJ SOLN
INTRAMUSCULAR | Status: DC | PRN
  Administered 2020-10-01: .1 mg via INTRAVENOUS

## 2020-10-01 MED ORDER — STERILE WATER FOR IRRIGATION IR SOLN: Status: DC | PRN

## 2020-10-01 SURGICAL SUPPLY — 8 items
BLOCK BITE 60FR ADLT L/F GRN (MISCELLANEOUS) ×3 IMPLANT
FORCEPS BIOP RAD 4 LRG CAP 4 (CUTTING FORCEPS) ×3 IMPLANT
GOWN CVR UNV OPN BCK APRN NK (MISCELLANEOUS) ×4 IMPLANT
GOWN ISOL THUMB LOOP REG UNIV (MISCELLANEOUS) ×2
KIT PRC NS LF DISP ENDO (KITS) ×2 IMPLANT
KIT PROCEDURE OLYMPUS (KITS) ×1
MANIFOLD NEPTUNE II (INSTRUMENTS) ×3 IMPLANT
WATER STERILE IRR 250ML POUR (IV SOLUTION) ×3 IMPLANT

## 2020-10-01 NOTE — Op Note (Signed)
[facility] Gastroenterology Patient Name: Tony [name] Procedure Date: 10/01/2020 10:42 AM MRN: [id number] Account #: [id number] Date of Birth: [birth year] Admit Type: Outpatient Age: 30 Room: [facility] OR ROOM 01 Gender: Male Note Status: Finalized Procedure:             Upper GI endoscopy Indications:           Epigastric abdominal pain Providers:             Lin Landsman MD, MD Referring MD:          Halina Maidens, MD (Referring MD) Medicines:             General Anesthesia Complications:         No immediate complications. Estimated blood loss: None. Procedure:             Pre-Anesthesia Assessment:                        - Prior to the procedure, a History and Physical was                         performed, and patient medications and allergies were                         reviewed. The patient is competent. The risks and                         benefits of the procedure and the sedation options and                         risks were discussed with the patient. All questions                         were answered and informed consent was obtained.                         Patient identification and proposed procedure were                         verified by the physician, the nurse, the                         anesthesiologist, the anesthetist and the technician                         in the pre-procedure area in the procedure room in the                         endoscopy suite. Mental Status Examination: alert and                         oriented. Airway Examination: normal oropharyngeal                         airway and neck mobility. Respiratory Examination:                         clear to auscultation. CV Examination: normal.  Prophylactic Antibiotics: The patient does not require                         prophylactic antibiotics. Prior Anticoagulants: The                         patient has taken no previous anticoagulant or                          antiplatelet agents. ASA Grade Assessment: II - A                         patient with mild systemic disease. After reviewing                         the risks and benefits, the patient was deemed in                         satisfactory condition to undergo the procedure. The                         anesthesia plan was to use general anesthesia.                         Immediately prior to administration of medications,                         the patient was re-assessed for adequacy to receive                         sedatives. The heart rate, respiratory rate, oxygen                         saturations, blood pressure, adequacy of pulmonary                         ventilation, and response to care were monitored                         throughout the procedure. The physical status of the                         patient was re-assessed after the procedure.                        After obtaining informed consent, the endoscope was                         passed under direct vision. Throughout the procedure,                         the patient's blood pressure, pulse, and oxygen                         saturations were monitored continuously. The Endoscope                         was introduced through the mouth, and advanced to the  second part of duodenum. The upper GI endoscopy was                         accomplished without difficulty. The patient tolerated                         the procedure well. Findings:      The duodenal bulb, second portion of the duodenum and examined duodenum       were normal.      The entire examined stomach was normal. Biopsies were taken with a cold       forceps for Helicobacter pylori testing.      The cardia and gastric fundus were normal on retroflexion.      Esophagogastric landmarks were identified: the gastroesophageal junction       was found at 40 cm from the incisors.      The gastroesophageal  junction and examined esophagus were normal. Impression:            - Normal duodenal bulb, second portion of the duodenum                         and examined duodenum.                        - Normal stomach. Biopsied.                        - Esophagogastric landmarks identified.                        - Normal gastroesophageal junction and esophagus. Recommendation:        - Await pathology results.                        - Proceed with colonoscopy as scheduled                        See colonoscopy report Procedure Code(s):     --- Professional ---                        [phone], Esophagogastroduodenoscopy, flexible,                         transoral; with biopsy, single or multiple Diagnosis Code(s):     --- Professional ---                        R10.13, Epigastric pain CPT copyright 2019 American Medical Association. All rights reserved. The codes documented in this report are preliminary and upon coder review may  be revised to meet current compliance requirements. Dr. Ulyess Mort Lin Landsman MD, MD 10/01/2020 11:03:08 AM This report has been signed electronically. Number of Addenda: 0 Note Initiated On: 10/01/2020 10:42 AM Total Procedure Duration: 0 hours 2 minutes 39 seconds  Estimated Blood Loss:  Estimated blood loss: none.      [facility]

## 2020-10-01 NOTE — H&P (Signed)
Tony Darby, MD [address]  [place]  [name], [name] [id number]  Main: [phone]  Fax: [phone] Pager: [phone]  Primary Care Physician:  Tony Hess, MD Primary Gastroenterologist:  Dr. Cephas [name]  Pre-Procedure History & Physical: HPI:  Tony [name] is a 30 y.o. male is here for an endoscopy and colonoscopy.   Past Medical History:  Diagnosis Date  . Anemia    vitamin d and b12 deficiency  . Colitis   . COVID-19 06/10/2020  . Headache    multiple/day.  . Seizures ([place])    seizure like activity.  has not had any symptoms since Topamax (march 2020)  . Tubular adenoma of colon    had polypectomy     Past Surgical History:  Procedure Laterality Date  . COLONOSCOPY WITH PROPOFOL N/A 03/08/2019   Procedure: COLONOSCOPY WITH PROPOFOL;  Surgeon: Tony Lame, MD;  Location: [place];  Service: Endoscopy;  Laterality: N/A;  . HERNIA REPAIR    . INGUINAL HERNIA REPAIR Right 07/30/2019   Procedure: LAPAROSCOPIC INGUINAL HERNIA;  Surgeon: Olean Ree, MD;  Location: [facility];  Service: General;  Laterality: Right;  . LIPOMA EXCISION  2019   lower right back  . POLYPECTOMY  03/08/2019   Procedure: POLYPECTOMY;  Surgeon: Tony Lame, MD;  Location: [place];  Service: Endoscopy;;  . TONSILLECTOMY    . VASECTOMY  04/25/2019    Prior to Admission medications   Medication Sig Start Date End Date Taking? Authorizing Provider  albuterol (VENTOLIN HFA) 108 (90 Base) MCG/ACT inhaler Inhale 2 puffs into the lungs every 6 (six) hours as needed. 06/29/20  Yes [provider]  busPIRone (BUSPAR) 15 MG tablet PLEASE SEE ATTACHED FOR DETAILED DIRECTIONS 07/02/20  Yes [provider]  DULoxetine (CYMBALTA) 60 MG capsule Take 60 mg by mouth daily. 08/27/20  Yes [provider]  gabapentin (NEURONTIN) 100 MG capsule Take by mouth. 08/24/20  Yes [provider]  ibuprofen (ADVIL) 800 MG tablet Take 1 tablet  (800 mg total) by mouth every 8 (eight) hours as needed. 09/22/20  Yes Sharion Balloon, NP  LORazepam (ATIVAN) 0.5 MG tablet Take by mouth. 05/28/20  Yes [provider]  prazosin (MINIPRESS) 2 MG capsule Take 2 mg by mouth at bedtime. 08/27/20  Yes [provider]  vitamin B-12 (CYANOCOBALAMIN) 1000 MCG tablet Take 1 tablet (1,000 mcg total) by mouth daily. 03/19/20  Yes Patrecia Pour, MD  Mesalamine (ASACOL) 400 MG CPDR DR capsule Take 2 capsules (800 mg total) by mouth 3 (three) times daily. 03/14/19 07/08/19  Tony Lame, MD    Allergies as of 09/24/2020  . (No Known Allergies)    Family History  Problem Relation Age of Onset  . Crohn's disease Mother   . Healthy Father     Social History   Socioeconomic History  . Marital status: Married    Spouse name: toyoko  . Number of children: 4  . Years of education: Not on file  . Highest education level: Not on file  Occupational History  . Occupation: lifts alot of weight  Tobacco Use  . Smoking status: Former Smoker    Years: 2.00    Types: Cigarettes    Quit date: 02/2017    Years since quitting: 3.6  . Smokeless tobacco: Never Used  . Tobacco comment: 1 pack per month  Vaping Use  . Vaping Use: Former  . Start date: 08/04/2016  . Quit date: 02/04/2017  Substance and Sexual Activity  . Alcohol use: Yes    Alcohol/week: 3.0 standard drinks    Types: 3 Cans of beer per week    Comment:    . Drug use: Never  . Sexual activity: Yes    Birth control/protection: Surgical    Comment: Vasectomy 04/25/2019  Other Topics Concern  . Not on file  Social History Narrative   Lives with wife and 2 of 4 children. Has the other 2 children in the summer   Social Determinants of Health   Financial Resource Strain: Not on file  Food Insecurity: Not on file  Transportation Needs: Not on file  Physical Activity: Not on file  Stress: Not on file  Social Connections: Not on file  Intimate Partner Violence: Not on file     Review of Systems: See HPI, otherwise negative ROS  Physical Exam: BP 120/81   Pulse 74   Temp (!) 96.9 F (36.1 C) (Temporal)   Resp 18   Ht 5\' 10"  (1.778 m)   Wt 93.9 kg   SpO2 99%   BMI 29.70 kg/m  General:   Alert,  pleasant and cooperative in NAD Head:  Normocephalic and atraumatic. Neck:  Supple; no masses or thyromegaly. Lungs:  Clear throughout to auscultation.    Heart:  Regular rate and rhythm. Abdomen:  Soft, nontender and nondistended. Normal bowel sounds, without guarding, and without rebound.   Neurologic:  Alert and  oriented x4;  grossly normal neurologically.  Impression/Plan: Tony [name] is here for an endoscopy and colonoscopy to be performed for epigastric pain, h/o terminal ileitis  Risks, benefits, limitations, and alternatives regarding  endoscopy and colonoscopy have been reviewed with the patient.  Questions have been answered.  All parties agreeable.   Sherri Sear, MD  10/01/2020, 9:40 AM

## 2020-10-01 NOTE — Op Note (Signed)
[facility] Gastroenterology Patient Name: Tony [name] Procedure Date: 10/01/2020 10:42 AM MRN: [id number] Account #: [id number] Date of Birth: 06-02-1991 Admit Type: Outpatient Age: 30 Room: [facility] OR ROOM 01 Gender: Male Note Status: Finalized Procedure:             Colonoscopy Indications:           Chronic diarrhea, Suspected Crohn's disease of the                         small bowel, elevated fecal calprotectin levels Providers:             Lin Landsman MD, MD Referring MD:          Halina Maidens, MD (Referring MD) Medicines:             General Anesthesia Complications:         No immediate complications. Estimated blood loss: None. Procedure:             Pre-Anesthesia Assessment:                        - Prior to the procedure, a History and Physical was                         performed, and patient medications and allergies were                         reviewed. The patient is competent. The risks and                         benefits of the procedure and the sedation options and                         risks were discussed with the patient. All questions                         were answered and informed consent was obtained.                         Patient identification and proposed procedure were                         verified by the physician, the nurse, the                         anesthesiologist, the anesthetist and the technician                         in the pre-procedure area in the procedure room in the                         endoscopy suite. Mental Status Examination: alert and                         oriented. Airway Examination: normal oropharyngeal                         airway and neck mobility. Respiratory Examination:  clear to auscultation. CV Examination: normal.                         Prophylactic Antibiotics: The patient does not require                         prophylactic antibiotics. Prior  Anticoagulants: The                         patient has taken no previous anticoagulant or                         antiplatelet agents. ASA Grade Assessment: II - A                         patient with mild systemic disease. After reviewing                         the risks and benefits, the patient was deemed in                         satisfactory condition to undergo the procedure. The                         anesthesia plan was to use general anesthesia.                         Immediately prior to administration of medications,                         the patient was re-assessed for adequacy to receive                         sedatives. The heart rate, respiratory rate, oxygen                         saturations, blood pressure, adequacy of pulmonary                         ventilation, and response to care were monitored                         throughout the procedure. The physical status of the                         patient was re-assessed after the procedure.                        After obtaining informed consent, the colonoscope was                         passed under direct vision. Throughout the procedure,                         the patient's blood pressure, pulse, and oxygen                         saturations were monitored continuously. The was  introduced through the anus and advanced to the the                         terminal ileum, with identification of the appendiceal                         orifice and IC valve. The colonoscopy was performed                         without difficulty. The patient tolerated the                         procedure well. The quality of the bowel preparation                         was evaluated using the BBPS [facility] Bowel Preparation                         Scale) with scores of: Right Colon = 3, Transverse                         Colon = 3 and Left Colon = 3 (entire mucosa seen well                         with  no residual staining, small fragments of stool or                         opaque liquid). The total BBPS score equals 9. Findings:      The perianal and digital rectal examinations were normal. Pertinent       negatives include normal sphincter tone and no palpable rectal lesions.      The terminal ileum appeared normal. Biopsies were taken with a cold       forceps for histology.      Normal mucosa was found in the left colon and in the right colon.       Biopsies were taken with a cold forceps for histology.      The retroflexed view of the distal rectum and anal verge was normal and       showed no anal or rectal abnormalities. Impression:            - The examined portion of the ileum was normal.                         Biopsied.                        - Normal mucosa in the left colon and in the right                         colon. Biopsied.                        - The distal rectum and anal verge are normal on                         retroflexion view. Recommendation:        - Discharge patient to home (with escort).                        -  Resume previous diet today.                        - Continue present medications.                        - Await pathology results.                        - Return to my office as previously scheduled. Procedure Code(s):     --- Professional ---                        [phone], Colonoscopy, flexible; with biopsy, single or                         multiple Diagnosis Code(s):     --- Professional ---                        K52.9, Noninfective gastroenteritis and colitis,                         unspecified CPT copyright 2019 American Medical Association. All rights reserved. The codes documented in this report are preliminary and upon coder review may  be revised to meet current compliance requirements. Dr. Ulyess Mort Lin Landsman MD, MD 10/01/2020 11:22:07 AM This report has been signed electronically. Number of Addenda: 0 Note  Initiated On: 10/01/2020 10:42 AM Scope Withdrawal Time: 0 hours 11 minutes 35 seconds  Total Procedure Duration: 0 hours 15 minutes 3 seconds  Estimated Blood Loss:  Estimated blood loss: none.      [facility]

## 2020-10-01 NOTE — Anesthesia Preprocedure Evaluation (Signed)
Anesthesia Evaluation  Patient identified by MRN, date of birth, ID band Patient awake    Reviewed: NPO status , Patient's Chart, lab work & pertinent test results  History of Anesthesia Complications Negative for: history of anesthetic complications  Airway Mallampati: II  TM Distance: >3 FB Neck ROM: Full    Dental   Pulmonary former smoker,    Pulmonary exam normal        Cardiovascular negative cardio ROS Normal cardiovascular exam     Neuro/Psych  Headaches, PSYCHIATRIC DISORDERS Anxiety Depression    GI/Hepatic   Endo/Other    Renal/GU negative Renal ROS     Musculoskeletal   Abdominal   Peds  Hematology   Anesthesia Other Findings   Reproductive/Obstetrics                             Anesthesia Physical Anesthesia Plan  ASA: II  Anesthesia Plan: General   Post-op Pain Management:    Induction: Intravenous  PONV Risk Score and Plan: 1 and Ondansetron  Airway Management Planned: Natural Airway  Additional Equipment:   Intra-op Plan:   Post-operative Plan:   Informed Consent: I have reviewed the patients History and Physical, chart, labs and discussed the procedure including the risks, benefits and alternatives for the proposed anesthesia with the patient or authorized representative who has indicated his/her understanding and acceptance.       Plan Discussed with: CRNA, Anesthesiologist and Surgeon  Anesthesia Plan Comments:         Anesthesia Quick Evaluation

## 2020-10-01 NOTE — Anesthesia Procedure Notes (Signed)
Date/Time: 10/01/2020 10:41 AM Performed by: Dionne Bucy, CRNA Pre-anesthesia Checklist: Patient identified, Emergency Drugs available, Suction available, Patient being monitored and Timeout performed Patient Re-evaluated:Patient Re-evaluated prior to induction Oxygen Delivery Method: Nasal cannula Placement Confirmation: positive ETCO2

## 2020-10-01 NOTE — Anesthesia Postprocedure Evaluation (Signed)
Anesthesia Post Note  Patient: Tony [name]  Procedure(s) Performed: COLONOSCOPY WITH BIOPSY (N/A Rectum) ESOPHAGOGASTRODUODENOSCOPY (EGD) WITH BIOPSY (N/A Mouth)     Patient location during evaluation: PACU Anesthesia Type: General Level of consciousness: awake and alert Pain management: pain level controlled Vital Signs Assessment: post-procedure vital signs reviewed and stable Respiratory status: spontaneous breathing, nonlabored ventilation and respiratory function stable Cardiovascular status: blood pressure returned to baseline and stable Postop Assessment: no apparent nausea or vomiting Anesthetic complications: no   No complications documented.  Wanda Plump Rupal Childress

## 2020-10-01 NOTE — Transfer of Care (Signed)
Immediate Anesthesia Transfer of Care Note  Patient: Tony [name]  Procedure(s) Performed: COLONOSCOPY WITH BIOPSY (N/A Rectum) ESOPHAGOGASTRODUODENOSCOPY (EGD) WITH BIOPSY (N/A Mouth)  Patient Location: PACU  Anesthesia Type: General  Level of Consciousness: awake, alert  and patient cooperative  Airway and Oxygen Therapy: Patient Spontanous Breathing and Patient connected to supplemental oxygen  Post-op Assessment: Post-op Vital signs reviewed, Patient's Cardiovascular Status Stable, Respiratory Function Stable, Patent Airway and No signs of Nausea or vomiting  Post-op Vital Signs: Reviewed and stable  Complications: No complications documented.

## 2020-10-02 ENCOUNTER — Encounter: Payer: Self-pay | Admitting: Gastroenterology

## 2020-10-05 ENCOUNTER — Encounter: Payer: Self-pay | Admitting: Gastroenterology

## 2020-10-05 LAB — SURGICAL PATHOLOGY

## 2020-10-28 ENCOUNTER — Encounter (HOSPITAL_COMMUNITY): Payer: Self-pay | Admitting: Emergency Medicine

## 2020-10-28 ENCOUNTER — Other Ambulatory Visit: Payer: Self-pay

## 2020-10-28 ENCOUNTER — Emergency Department (HOSPITAL_COMMUNITY)
Admission: EM | Admit: 2020-10-28 | Discharge: 2020-10-28 | Disposition: A | Discharge status: Home / Self Care | Payer: No Typology Code available for payment source | Attending: Emergency Medicine | Admitting: Emergency Medicine

## 2020-10-28 DIAGNOSIS — Z8616 Personal history of COVID-19: Secondary | ICD-10-CM | POA: Insufficient documentation

## 2020-10-28 DIAGNOSIS — Z87891 Personal history of nicotine dependence: Secondary | ICD-10-CM | POA: Insufficient documentation

## 2020-10-28 DIAGNOSIS — Z23 Encounter for immunization: Secondary | ICD-10-CM | POA: Diagnosis not present

## 2020-10-28 DIAGNOSIS — S6991XA Unspecified injury of right wrist, hand and finger(s), initial encounter: Secondary | ICD-10-CM | POA: Diagnosis present

## 2020-10-28 DIAGNOSIS — W268XXA Contact with other sharp object(s), not elsewhere classified, initial encounter: Secondary | ICD-10-CM | POA: Insufficient documentation

## 2020-10-28 DIAGNOSIS — Y990 Civilian activity done for income or pay: Secondary | ICD-10-CM | POA: Diagnosis not present

## 2020-10-28 DIAGNOSIS — S61511A Laceration without foreign body of right wrist, initial encounter: Secondary | ICD-10-CM

## 2020-10-28 MED ORDER — LIDOCAINE-EPINEPHRINE (PF) 2 %-1:200000 IJ SOLN
10.0000 mL | Freq: Once | INTRAMUSCULAR | Status: AC
  Administered 2020-10-28: 10 mL
  Filled 2020-10-28: qty 20

## 2020-10-28 MED ORDER — BUPIVACAINE HCL (PF) 0.5 % IJ SOLN
10.0000 mL | Freq: Once | INTRAMUSCULAR | Status: AC
  Administered 2020-10-28: 10 mL
  Filled 2020-10-28: qty 30

## 2020-10-28 MED ORDER — TETANUS-DIPHTH-ACELL PERTUSSIS 5-2.5-18.5 LF-MCG/0.5 IM SUSY
0.5000 mL | PREFILLED_SYRINGE | Freq: Once | INTRAMUSCULAR | Status: AC
  Administered 2020-10-28: 0.5 mL via INTRAMUSCULAR
  Filled 2020-10-28: qty 0.5

## 2020-10-28 NOTE — ED Provider Notes (Signed)
Tony [name]   CSN: [id number] Arrival date & time: 10/28/20  1248     History Chief Complaint  Patient presents with  . Extremity Laceration    Tony [name] is a 30 y.o. male.  HPI      Tony [name] is a 30 y.o. male, with a history of anemia, presenting to the ED with laceration to the right wrist shortly prior to arrival.  Patient states he was using a folding box cutter when the following mechanism collapsed and the blade folded inward, cutting his wrist.  Patient is right-hand dominant. He confirms this was accidental and not intentional. Unknown last tetanus vaccination. He endorses pain at the site as well as some tingling to the right hand.  Denies numbness, weakness, pulsatile bleeding, other injuries.  Past Medical History:  Diagnosis Date  . Anemia    vitamin d and b12 deficiency  . Colitis   . COVID-19 06/10/2020  . Headache    multiple/day.  . Seizures ([place])    seizure like activity.  has not had any symptoms since Topamax (march 2020)  . Tubular adenoma of colon    had polypectomy     Patient Active Problem List   Diagnosis Date Noted  . Abdominal pain, epigastric   . Ileitis 09/24/2020  . Complicated migraine 70/17/7939  . Depression 02/08/2019    Past Surgical History:  Procedure Laterality Date  . COLONOSCOPY WITH PROPOFOL N/A 03/08/2019   Procedure: COLONOSCOPY WITH PROPOFOL;  Surgeon: Lucilla Lame, MD;  Location: [place];  Service: Endoscopy;  Laterality: N/A;  . COLONOSCOPY WITH PROPOFOL N/A 10/01/2020   Procedure: COLONOSCOPY WITH BIOPSY;  Surgeon: Lin Landsman, MD;  Location: [place];  Service: Endoscopy;  Laterality: N/A;  . ESOPHAGOGASTRODUODENOSCOPY (EGD) WITH PROPOFOL N/A 10/01/2020   Procedure: ESOPHAGOGASTRODUODENOSCOPY (EGD) WITH BIOPSY;  Surgeon: Lin Landsman, MD;  Location: [place];  Service: Endoscopy;  Laterality: N/A;  . HERNIA  REPAIR    . INGUINAL HERNIA REPAIR Right 07/30/2019   Procedure: LAPAROSCOPIC INGUINAL HERNIA;  Surgeon: Olean Ree, MD;  Location: [facility];  Service: General;  Laterality: Right;  . LIPOMA EXCISION  2019   lower right back  . POLYPECTOMY  03/08/2019   Procedure: POLYPECTOMY;  Surgeon: Lucilla Lame, MD;  Location: [place];  Service: Endoscopy;;  . TONSILLECTOMY    . VASECTOMY  04/25/2019       Family History  Problem Relation Age of Onset  . Crohn's disease Mother   . Healthy Father     Social History   Tobacco Use  . Smoking status: Former Smoker    Years: 2.00    Types: Cigarettes    Quit date: 02/2017    Years since quitting: 3.7  . Smokeless tobacco: Never Used  . Tobacco comment: 1 pack per month  Vaping Use  . Vaping Use: Former  . Start date: 08/04/2016  . Quit date: 02/04/2017  Substance Use Topics  . Alcohol use: Yes    Alcohol/week: 3.0 standard drinks    Types: 3 Cans of beer per week    Comment:    . Drug use: Never    Home Medications Prior to Admission medications   Medication Sig Start Date End Date Taking? Authorizing Provider  albuterol (VENTOLIN HFA) 108 (90 Base) MCG/ACT inhaler Inhale 2 puffs into the lungs every 6 (six) hours as needed. 06/29/20   [provider]  busPIRone (BUSPAR) 15 MG tablet PLEASE  SEE ATTACHED FOR DETAILED DIRECTIONS 07/02/20   [provider]  DULoxetine (CYMBALTA) 60 MG capsule Take 60 mg by mouth daily. 08/27/20   [provider]  gabapentin (NEURONTIN) 100 MG capsule Take by mouth. 08/24/20   [provider]  ibuprofen (ADVIL) 800 MG tablet Take 1 tablet (800 mg total) by mouth every 8 (eight) hours as needed. 09/22/20   Sharion Balloon, NP  LORazepam (ATIVAN) 0.5 MG tablet Take by mouth. 05/28/20   [provider]  prazosin (MINIPRESS) 2 MG capsule Take 2 mg by mouth at bedtime. 08/27/20   [provider]  vitamin B-12 (CYANOCOBALAMIN) 1000 MCG tablet Take 1  tablet (1,000 mcg total) by mouth daily. 03/19/20   Patrecia Pour, MD  Mesalamine (ASACOL) 400 MG CPDR DR capsule Take 2 capsules (800 mg total) by mouth 3 (three) times daily. 03/14/19 07/08/19  Lucilla Lame, MD    Allergies    Patient has no known allergies.  Review of Systems   Review of Systems  Constitutional: Negative for diaphoresis.  Musculoskeletal: Positive for arthralgias.  Skin: Positive for wound.  Neurological: Negative for weakness and numbness.    Physical Exam Updated Vital Signs BP 115/82 (BP Location: Left Arm)   Pulse 79   Temp 98.5 F (36.9 C) (Oral)   Resp 18   Ht 5\' 10"  (1.778 m)   Wt 95.3 kg   SpO2 97%   BMI 30.13 kg/m   Physical Exam Vitals and nursing [name] reviewed.  Constitutional:      General: He is not in acute distress.    Appearance: He is well-developed. He is not diaphoretic.  HENT:     Head: Normocephalic and atraumatic.  Eyes:     Conjunctiva/sclera: Conjunctivae normal.  Cardiovascular:     Rate and Rhythm: Normal rate and regular rhythm.     Pulses:          Radial pulses are 2+ on the right side.  Pulmonary:     Effort: Pulmonary effort is normal.  Musculoskeletal:     Cervical back: Neck supple.     Comments: Approximately 4.5 cm laceration to the volar right wrist, as shown.  I am able to open the wound and observe what appears to be the base of the wound. No pulsatile bleeding noted. Full range of motion through the cardinal directions of the wrist.  Full range of motion in the fingers of the right hand.  Skin:    General: Skin is warm and dry.     Capillary Refill: Capillary refill takes less than 2 seconds.     Coloration: Skin is not pale.  Neurological:     Mental Status: He is alert.     Comments: Sensation grossly intact to light touch through each of the nerve distributions of the bilateral upper extremities. Abduction and adduction of the fingers intact against resistance. Grip strength equal  bilaterally. Supination and pronation intact against resistance. Strength 5/5 through the cardinal directions of the bilateral wrists. Strength 5/5 with flexion and extension of the bilateral elbows. Patient can touch the thumb to each one of the fingertips without difficulty.  Patient can hold the "OK" sign against resistance.  Psychiatric:        Behavior: Behavior normal.          ED Results / Procedures / Treatments   Labs (all labs ordered are listed, but only abnormal results are displayed) Labs Reviewed - No data to display  EKG None  Radiology No results found.  Procedures .Marland KitchenLaceration Repair  Date/Time: 10/28/2020 2:20 PM Performed by: Lorayne Bender, PA-C Authorized by: Lorayne Bender, PA-C   Consent:    Consent obtained:  Verbal   Consent given by:  Patient   Risks, benefits, and alternatives were discussed: yes     Risks discussed:  Infection, need for additional repair, nerve damage, poor wound healing, poor cosmetic result, pain, tendon damage and vascular damage Universal protocol:    Procedure explained and questions answered to patient or proxy's satisfaction: yes     Patient identity confirmed:  Verbally with patient and provided demographic data Laceration details:    Location:  Hand   Hand location:  R wrist   Length (cm):  4.5 Pre-procedure details:    Preparation:  Patient was prepped and draped in usual sterile fashion Exploration:    Hemostasis achieved with:  Epinephrine   Wound exploration: wound explored through full range of motion and entire depth of wound visualized   Treatment:    Area cleansed with:  Povidone-iodine and saline   Amount of cleaning:  Extensive   Irrigation solution:  Sterile saline   Irrigation volume:  500cc   Irrigation method:  Syringe Skin repair:    Repair method:  Sutures   Suture size:  3-0   Suture material:  Prolene   Suture technique:  Horizontal mattress   Number of sutures:  7 Approximation:     Approximation:  Close Repair type:    Repair type:  Intermediate Post-procedure details:    Dressing:  Non-adherent dressing and sterile dressing   Procedure completion:  Tolerated well, no immediate complications     Medications Ordered in ED Medications  bupivacaine (MARCAINE) 0.5 % injection 10 mL (10 mLs Infiltration Given 10/28/20 1435)  lidocaine-EPINEPHrine (XYLOCAINE W/EPI) 2 %-1:200000 (PF) injection 10 mL (10 mLs Infiltration Given by Other 10/28/20 1435)  Tdap (BOOSTRIX) injection 0.5 mL (0.5 mLs Intramuscular Given 10/28/20 1404)    ED Course  I have reviewed the triage vital signs and the nursing notes.  Pertinent labs & imaging results that were available during my care of the patient were reviewed by me and considered in my medical decision making (see chart for details).    MDM Rules/Calculators/A&P                          Patient presents with laceration to the right volar wrist. No focal neurologic deficits.  No findings to suggest severe vascular injury. Circulation, motor function, sensation confirmed to be intact before and after suturing. The patient was given instructions for home care as well as return precautions. Patient voices understanding of these instructions, accepts the plan, and is comfortable with discharge.    Final Clinical Impression(s) / ED Diagnoses Final diagnoses:  Laceration of right wrist, initial encounter    Rx / DC Orders ED Discharge Orders    None       Layla Maw 10/28/20 1545    Valarie Merino, MD 10/28/20 2258

## 2020-10-28 NOTE — Discharge Instructions (Signed)
  Wound Care - Laceration You may remove the bandage after 24 hours. Clean the wound and surrounding area gently with tap water and mild soap. Rinse well and blot dry. Do not scrub the wound, as this may cause the wound edges to come apart. You may shower, but avoid submerging the wound, such as with a bath or swimming. Clean the wound daily to prevent infection. Do not use cleaners such as hydrogen peroxide or alcohol.   Scar reduction: Application of a topical antibiotic ointment, such as Neosporin, after the wound has begun to close and heal well can decrease scab formation and reduce scarring. After the wound has healed and wound closures have been removed, application of ointments such as Aquaphor can also reduce scar formation.  The key to scar reduction is keeping the skin well hydrated and supple. Drinking plenty of water throughout the day (At least eight 8oz glasses of water a day) is essential to staying well hydrated.  Sun exposure: Keep the wound out of the sun. After the wound has healed, continue to protect it from the sun by wearing protective clothing or applying sunscreen.  Pain: You may use Tylenol, naproxen, or ibuprofen for pain.  Suture/staple removal: Return to the ED in 8-12 days for suture removal.  Return to the ED sooner should the wound edges come apart or signs of infection arise, such as spreading redness, puffiness/swelling, pus draining from the wound, severe increase in pain, fever over 100.22F, or any other major issues.  For prescription assistance, may try using prescription discount sites or apps, such as goodrx.com

## 2020-10-28 NOTE — ED Triage Notes (Signed)
Patient cut his R wrist with a box cutter around 12:30 today, unsure of last tetanus. Able to move fingers, good circulation, approx 3 in laceration to inner R wrist.

## 2020-11-25 ENCOUNTER — Other Ambulatory Visit: Payer: Self-pay

## 2020-11-27 ENCOUNTER — Ambulatory Visit: Payer: 59 | Admitting: Gastroenterology

## 2021-01-21 ENCOUNTER — Other Ambulatory Visit: Payer: Self-pay

## 2021-01-21 ENCOUNTER — Emergency Department: Payer: Self-pay

## 2021-01-21 ENCOUNTER — Emergency Department
Admission: EM | Admit: 2021-01-21 | Discharge: 2021-01-21 | Disposition: A | Discharge status: Home / Self Care | Payer: Self-pay | Attending: Emergency Medicine | Admitting: Emergency Medicine

## 2021-01-21 DIAGNOSIS — Y9389 Activity, other specified: Secondary | ICD-10-CM | POA: Insufficient documentation

## 2021-01-21 DIAGNOSIS — Z8616 Personal history of COVID-19: Secondary | ICD-10-CM | POA: Insufficient documentation

## 2021-01-21 DIAGNOSIS — Z87891 Personal history of nicotine dependence: Secondary | ICD-10-CM | POA: Insufficient documentation

## 2021-01-21 DIAGNOSIS — S060X1A Concussion with loss of consciousness of 30 minutes or less, initial encounter: Secondary | ICD-10-CM | POA: Insufficient documentation

## 2021-01-21 DIAGNOSIS — Y92013 Bedroom of single-family (private) house as the place of occurrence of the external cause: Secondary | ICD-10-CM | POA: Insufficient documentation

## 2021-01-21 DIAGNOSIS — R5383 Other fatigue: Secondary | ICD-10-CM | POA: Insufficient documentation

## 2021-01-21 DIAGNOSIS — W500XXA Accidental hit or strike by another person, initial encounter: Secondary | ICD-10-CM | POA: Insufficient documentation

## 2021-01-21 MED ORDER — MECLIZINE HCL 25 MG PO TABS: 25.0000 mg | ORAL_TABLET | Freq: Three times a day (TID) | ORAL | 0 refills | Status: AC | PRN

## 2021-01-21 MED ORDER — PROCHLORPERAZINE MALEATE 10 MG PO TABS: 10.0000 mg | ORAL_TABLET | Freq: Four times a day (QID) | ORAL | 0 refills | Status: AC | PRN

## 2021-01-21 MED ORDER — KETOROLAC TROMETHAMINE 60 MG/2ML IM SOLN
60.0000 mg | Freq: Once | INTRAMUSCULAR | Status: AC
  Administered 2021-01-21: 60 mg via INTRAMUSCULAR
  Filled 2021-01-21: qty 2

## 2021-01-21 MED ORDER — DEXAMETHASONE 6 MG PO TABS
10.0000 mg | ORAL_TABLET | Freq: Once | ORAL | Status: AC
  Administered 2021-01-21: 10 mg via ORAL
  Filled 2021-01-21: qty 1

## 2021-01-21 MED ORDER — DIPHENHYDRAMINE HCL 50 MG/ML IJ SOLN
25.0000 mg | Freq: Once | INTRAMUSCULAR | Status: AC
  Administered 2021-01-21: 25 mg via INTRAMUSCULAR
  Filled 2021-01-21: qty 1

## 2021-01-21 MED ORDER — PROCHLORPERAZINE EDISYLATE 10 MG/2ML IJ SOLN
10.0000 mg | Freq: Once | INTRAMUSCULAR | Status: AC
  Administered 2021-01-21: 10 mg via INTRAMUSCULAR
  Filled 2021-01-21: qty 2

## 2021-01-21 MED ORDER — IBUPROFEN 600 MG PO TABS: 600.0000 mg | ORAL_TABLET | Freq: Three times a day (TID) | ORAL | 0 refills | Status: AC | PRN

## 2021-01-21 NOTE — ED Provider Notes (Signed)
[facility] Emergency Department Provider Note  ____________________________________________   Event Date/Time   First MD Initiated Contact with Patient 01/21/21 1321     (approximate)  I have reviewed the triage vital signs and the nursing notes.   HISTORY  Chief Complaint Numbness and Head Injury    HPI Tony [name] is a 30 y.o. male with past medical history as below here with headache.  The patient states that he was in bed 2 nights ago when his toddler jumped in the bed and struck him directly onto his left temple.  He reports immediate onset of severe pain.  He believes he was knocked out.  He states that since then, he has had 10 out of 10, aching, throbbing, left-sided headache.  This is been associated with nausea, vomiting x2, and general fatigue.  He has felt off balance.  He has a history of prior migraines with complicated features as well as recurrent concussions.  Denies any neck pain.  No vision changes.  He has felt somewhat off balance but denies any other falls or trauma.  No fevers or chills.  No other complaints.  He was well when he went to bed.    Past Medical History:  Diagnosis Date   Anemia    vitamin d and b12 deficiency   Colitis    COVID-19 06/10/2020   Headache    multiple/day.   Seizures ([place])    seizure like activity.  has not had any symptoms since Topamax (march 2020)   Tubular adenoma of colon    had polypectomy     Patient Active Problem List   Diagnosis Date Noted   Abdominal pain, epigastric    Ileitis 0000000   Complicated migraine [id number]   Depression 02/08/2019    Past Surgical History:  Procedure Laterality Date   COLONOSCOPY WITH PROPOFOL N/A 03/08/2019   Procedure: COLONOSCOPY WITH PROPOFOL;  Surgeon: Lucilla Lame, MD;  Location: [place];  Service: Endoscopy;  Laterality: N/A;   COLONOSCOPY WITH PROPOFOL N/A 10/01/2020   Procedure: COLONOSCOPY WITH BIOPSY;  Surgeon: Lin Landsman, MD;  Location: [place];  Service: Endoscopy;  Laterality: N/A;   ESOPHAGOGASTRODUODENOSCOPY (EGD) WITH PROPOFOL N/A 10/01/2020   Procedure: ESOPHAGOGASTRODUODENOSCOPY (EGD) WITH BIOPSY;  Surgeon: Lin Landsman, MD;  Location: [place];  Service: Endoscopy;  Laterality: N/A;   HERNIA REPAIR     INGUINAL HERNIA REPAIR Right 07/30/2019   Procedure: LAPAROSCOPIC INGUINAL HERNIA;  Surgeon: Olean Ree, MD;  Location: [facility];  Service: General;  Laterality: Right;   LIPOMA EXCISION  2019   lower right back   POLYPECTOMY  03/08/2019   Procedure: POLYPECTOMY;  Surgeon: Lucilla Lame, MD;  Location: [place];  Service: Endoscopy;;   TONSILLECTOMY     VASECTOMY  04/25/2019    Prior to Admission medications   Medication Sig Start Date End Date Taking? Authorizing Provider  ibuprofen (ADVIL) 600 MG tablet Take 1 tablet (600 mg total) by mouth every 8 (eight) hours as needed for moderate pain. 01/21/21  Yes Duffy Bruce, MD  meclizine (ANTIVERT) 25 MG tablet Take 1 tablet (25 mg total) by mouth 3 (three) times daily as needed for dizziness. 01/21/21  Yes Duffy Bruce, MD  prochlorperazine (COMPAZINE) 10 MG tablet Take 1 tablet (10 mg total) by mouth every 6 (six) hours as needed (headache, nausea). 01/21/21  Yes Duffy Bruce, MD  albuterol (VENTOLIN HFA) 108 (90 Base) MCG/ACT inhaler Inhale 2 puffs into the  lungs every 6 (six) hours as needed. 06/29/20   [provider]  busPIRone (BUSPAR) 15 MG tablet Take by mouth. 09/24/20   [provider]  DULoxetine (CYMBALTA) 60 MG capsule Take 60 mg by mouth daily. 08/27/20   [provider]  gabapentin (NEURONTIN) 100 MG capsule 100 mg 2 (two) times daily 08/24/20   [provider]  lamoTRIgine (LAMICTAL) 25 MG tablet Take by mouth. 11/10/20   [provider]  lithium carbonate (LITHOBID) 300 MG CR tablet Take 300 mg by mouth daily as needed. 10/27/20   [provider]  LORazepam (ATIVAN) 0.5 MG tablet 0.5 mg nightly as needed 09/24/20   [provider]  naproxen (NAPROSYN) 500 MG tablet Take by mouth. 09/29/20   [provider]  prazosin (MINIPRESS) 2 MG capsule Take 2 mg by mouth at bedtime. 08/27/20   [provider]  traMADol (ULTRAM) 50 MG tablet Take 1 tablet by mouth every 6 (six) hours as needed. 03/21/20   [provider]  vitamin B-12 (CYANOCOBALAMIN) 1000 MCG tablet Take 1 tablet (1,000 mcg total) by mouth daily. 03/19/20   Patrecia Pour, MD  Mesalamine (ASACOL) 400 MG CPDR DR capsule Take 2 capsules (800 mg total) by mouth 3 (three) times daily. 03/14/19 07/08/19  Lucilla Lame, MD    Allergies Patient has no known allergies.  Family History  Problem Relation Age of Onset   Crohn's disease Mother    Healthy Father     Social History Social History   Tobacco Use   Smoking status: Former    Years: 2.00    Types: Cigarettes    Quit date: 02/2017    Years since quitting: 3.9   Smokeless tobacco: Never   Tobacco comments:    1 pack per month  Vaping Use   Vaping Use: Former   Start date: 08/04/2016   Quit date: 02/04/2017  Substance Use Topics   Alcohol use: Yes    Alcohol/week: 3.0 standard drinks    Types: 3 Cans of beer per week    Comment:     Drug use: Never    Review of Systems  Review of Systems  Constitutional:  Positive for fatigue. Negative for chills and fever.  HENT:  Negative for sore throat.   Respiratory:  Negative for shortness of breath.   Cardiovascular:  Negative for chest pain.  Gastrointestinal:  Positive for nausea and vomiting. Negative for abdominal pain.  Genitourinary:  Negative for flank pain.  Musculoskeletal:  Positive for gait problem. Negative for neck pain.  Skin:  Negative for rash and wound.  Allergic/Immunologic: Negative for immunocompromised state.  Neurological:  Positive for numbness. Negative for weakness.  Hematological:  Does not  bruise/bleed easily.  All other systems reviewed and are negative.   ____________________________________________  PHYSICAL EXAM:      VITAL SIGNS: ED Triage Vitals  Enc Vitals Group     BP 01/21/21 1249 130/83     Pulse Rate 01/21/21 1249 82     Resp 01/21/21 1249 20     Temp 01/21/21 1249 98.3 F (36.8 C)     Temp Source 01/21/21 1249 Oral     SpO2 01/21/21 1249 99 %     Weight 01/21/21 1250 210 lb (95.3 kg)     Height 01/21/21 1250 '5\' 10"'$  (1.778 m)     Head Circumference --      Peak Flow --      Pain Score 01/21/21 1249 7  Pain Loc --      Pain Edu? --      Excl. in [place]? --      Physical Exam Vitals and nursing note reviewed.  Constitutional:      General: He is not in acute distress.    Appearance: He is well-developed.  HENT:     Head: Normocephalic and atraumatic.     Comments: No significant maxillary sinus tenderness.  No bruising or obvious trauma to the scalp or head.  Moderate tenderness over the left temporal area.  No postauricular ecchymoses. Eyes:     Conjunctiva/sclera: Conjunctivae normal.  Cardiovascular:     Rate and Rhythm: Normal rate and regular rhythm.     Heart sounds: Normal heart sounds. No murmur heard.   No friction rub.  Pulmonary:     Effort: Pulmonary effort is normal. No respiratory distress.     Breath sounds: Normal breath sounds. No wheezing or rales.  Abdominal:     General: There is no distension.     Palpations: Abdomen is soft.     Tenderness: There is no abdominal tenderness.  Musculoskeletal:     Cervical back: Neck supple.  Skin:    General: Skin is warm.     Capillary Refill: Capillary refill takes less than 2 seconds.  Neurological:     Mental Status: He is alert and oriented to person, place, and time.     Motor: No abnormal muscle tone.     Comments: Neurological Exam:  Mental Status: Alert and oriented to person, place, and time. Attention and concentration normal. Speech clear. Recent memory is  intact. Cranial Nerves: Visual fields grossly intact. EOMI and PERRLA. No nystagmus noted. Facial sensation intact at forehead, maxillary cheek, and chin/mandible bilaterally. No facial asymmetry or weakness. Hearing grossly normal. Uvula is midline, and palate elevates symmetrically. Normal SCM and trapezius strength. Tongue midline without fasciculations. Motor: Muscle strength 5/5 in proximal and distal UE and LE bilaterally. No pronator drift. Muscle tone normal. Sensation: Intact to light touch in upper and lower extremities distally bilaterally.  Coordination: Normal FTN bilaterally.         ____________________________________________   LABS (all labs ordered are listed, but only abnormal results are displayed)  Labs Reviewed - No data to display  ____________________________________________  EKG:  ________________________________________  RADIOLOGY All imaging, including plain films, CT scans, and ultrasounds, independently reviewed by me, and interpretations confirmed via formal radiology reads.  ED MD interpretation:   CT head: No acute intracranial abnormality Official radiology report(s): CT HEAD WO CONTRAST (5MM)  Result Date: 01/21/2021 CLINICAL DATA:  Remote head trauma. Dizziness, balance and left facial numbness. EXAM: CT HEAD WITHOUT CONTRAST TECHNIQUE: Contiguous axial images were obtained from the base of the skull through the vertex without intravenous contrast. COMPARISON:  None. FINDINGS: Brain: The ventricles are normal in size and configuration. No extra-axial fluid collections are identified. The gray-white differentiation is maintained. No CT findings for acute hemispheric infarction or intracranial hemorrhage. No mass lesions. The brainstem and cerebellum are normal. Vascular: No hyperdense vessels or obvious aneurysm. Skull: No acute skull fracture.  No bone lesion. Sinuses/Orbits: Scattered ethmoid sinus mucoperiosteal thickening and mucous retention cyst  or polyp in the right maxillary sinus. The mastoid air cells and middle ear cavities are clear. Globes are intact. Other: No scalp lesions or scalp hematoma. IMPRESSION: 1. No acute intracranial findings or mass lesions. 2. Ethmoid and right maxillary sinus disease. Electronically Signed   By: Ricky Stabs.D.  On: 01/21/2021 13:50    ____________________________________________  PROCEDURES   Procedure(s) performed (including Critical Care):  Procedures  ____________________________________________  INITIAL IMPRESSION / MDM / [place] / ED COURSE  As part of my medical decision making, I reviewed the following data within the [place] notes reviewed and incorporated, Old chart reviewed, Notes from prior ED visits, and [place] Controlled Substance Database       *Damichael Proia was evaluated in Emergency Department on 01/21/2021 for the symptoms described in the history of present illness. He was evaluated in the context of the global COVID-19 pandemic, which necessitated consideration that the patient might be at risk for infection with the SARS-CoV-2 virus that causes COVID-19. Institutional protocols and algorithms that pertain to the evaluation of patients at risk for COVID-19 are in a state of rapid change based on information released by regulatory bodies including the CDC and federal and state organizations. These policies and algorithms were followed during the patient's care in the ED.  Some ED evaluations and interventions may be delayed as a result of limited staffing during the pandemic.*     Medical Decision Making: 30 year old male here with headache, dizziness, after head trauma 2 nights ago.  Patient is afebrile and hemodynamically stable.  Neurological exam is unremarkable.  He has some subjective numbness but this is in nonorganic distribution and I suspect it is related to possible complex migraine and concussion in the setting of head  trauma.  CT head reviewed, shows no evidence of acute abnormality.  He has reported sinus disease but has no symptoms of this, suspect this is chronic.  Patient given migraine cocktail with improvement in his symptoms and he is able to ambulate without difficulty after this.  Will discharge with neurology follow-up for recurrent concussion as well as complicated migraine history.  Will give brief course of symptomatic control at home.  ____________________________________________  FINAL CLINICAL IMPRESSION(S) / ED DIAGNOSES  Final diagnoses:  Concussion with loss of consciousness of 30 minutes or less, initial encounter     MEDICATIONS GIVEN DURING THIS VISIT:  Medications  prochlorperazine (COMPAZINE) injection 10 mg (10 mg Intramuscular Given 01/21/21 1404)  diphenhydrAMINE (BENADRYL) injection 25 mg (25 mg Intramuscular Given 01/21/21 1404)  dexamethasone (DECADRON) tablet 10 mg (10 mg Oral Given 01/21/21 1405)  ketorolac (TORADOL) injection 60 mg (60 mg Intramuscular Given 01/21/21 1405)     ED Discharge Orders          Ordered    prochlorperazine (COMPAZINE) 10 MG tablet  Every 6 hours PRN        01/21/21 1427    meclizine (ANTIVERT) 25 MG tablet  3 times daily PRN        01/21/21 1427    ibuprofen (ADVIL) 600 MG tablet  Every 8 hours PRN        01/21/21 1427             Note:  This document was prepared using Dragon voice recognition software and may include unintentional dictation errors.   Duffy Bruce, MD 01/21/21 [phone]

## 2021-01-21 NOTE — ED Triage Notes (Signed)
Pt was playing with his son 2 nights ago, his son hit  him on his left temple, and it knocked him out, he is now having numbness on the left side of his face, feels dizzy and off balance. He has also vomited twice.
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
## 2021-02-12 ENCOUNTER — Ambulatory Visit: Payer: Self-pay | Admitting: Internal Medicine

## 2022-10-08 IMAGING — CT CT ANGIO NECK
2 of 7 series · 8 of 33 positions shown · IV contrast (APPLIED)
Comparison: None.

CLINICAL DATA: Left-sided facial and body numbness with headache.
History of TIA.

EXAM:
CT ANGIOGRAPHY HEAD AND NECK
TECHNIQUE: Multidetector CT imaging of the head and neck was performed using
the standard protocol during bolus administration of intravenous
contrast. Multiplanar CT image reconstructions and MIPs were
obtained to evaluate the vascular anatomy. Carotid stenosis
measurements (when applicable) are obtained utilizing NASCET
criteria, using the distal internal carotid diameter as the
denominator.
CONTRAST:  75mL OMNIPAQUE IOHEXOL 350 MG/ML SOLN

[Series 5: cta head neck · axial · 0.52mm/px · z∈[+424,+550]mm · 2 of 190 slices shown]
[im 64/190  soft-tissue]
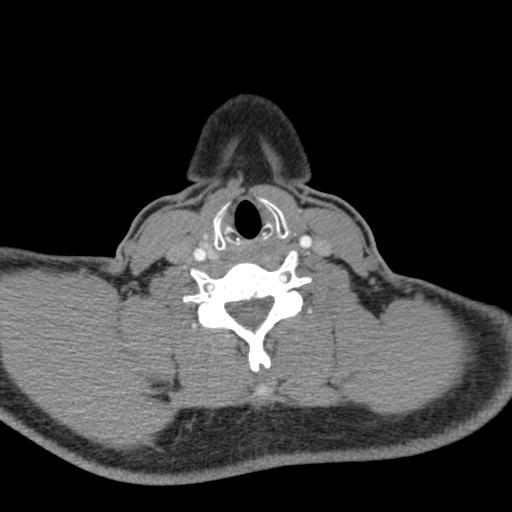
[im 127/190  soft-tissue]
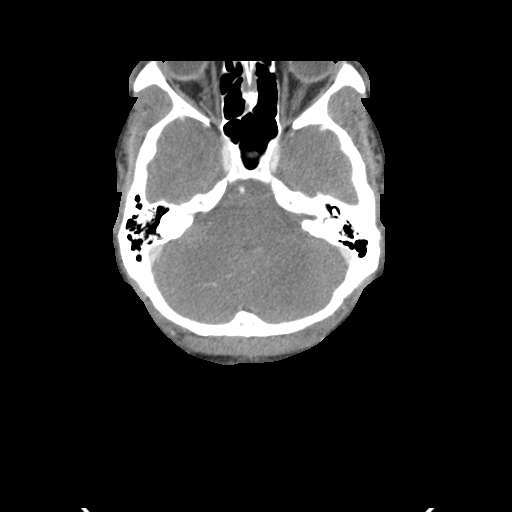

[Series 7: ax thin · axial · 0.58mm/px · z∈[+351,+617]mm · 6 of 374 slices shown]
[im 54/374  soft-tissue]
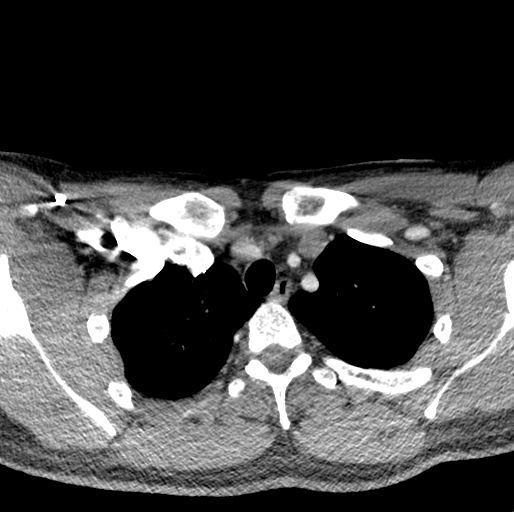
[im 107/374  bone]
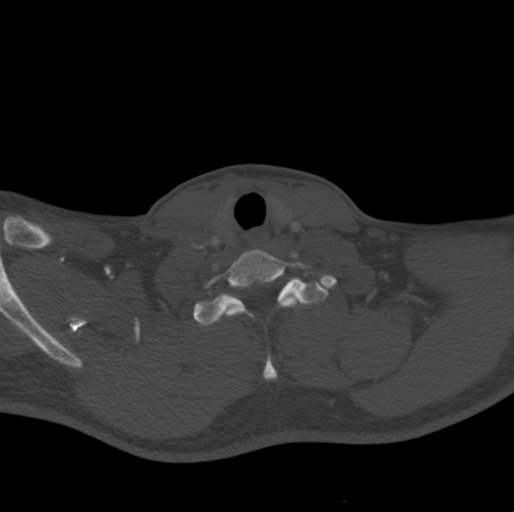
[im 160/374  soft-tissue]
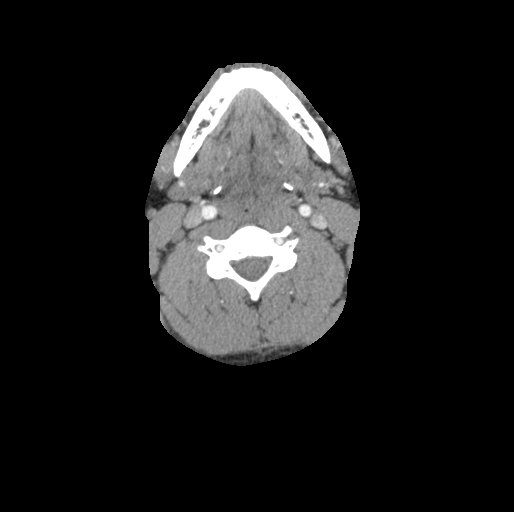
[im 214/374  bone]
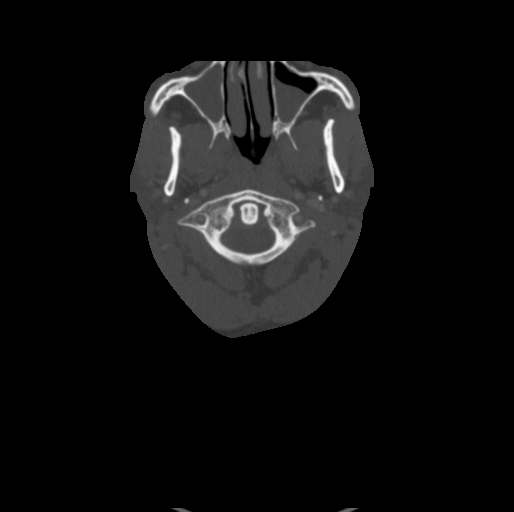
[im 267/374  soft-tissue]
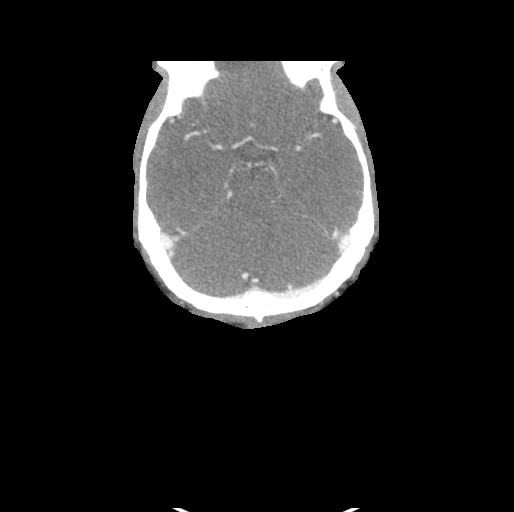
[im 320/374  bone]
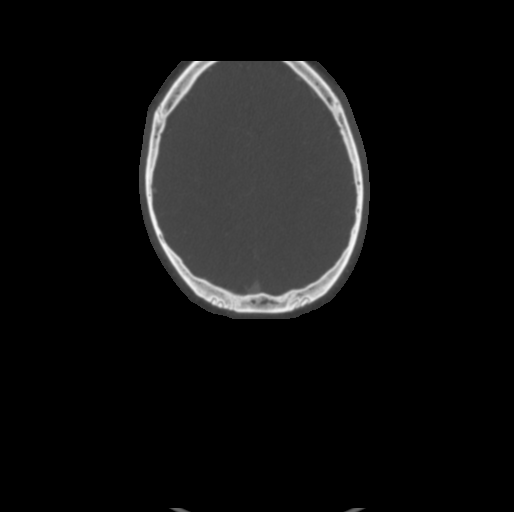

[8 of 33 positions shown; findings below may reference images not displayed]

FINDINGS: CTA NECK FINDINGS

Aortic arch: Standard 3 vessel aortic arch with widely patent arch
vessel origins.

Right carotid system: Patent and smooth without evidence of stenosis
or dissection.

Left carotid system: Patent and smooth without evidence of stenosis
or dissection.

Vertebral arteries: Patent and smooth without evidence of stenosis
or dissection. Mildly to moderately dominant left vertebral artery.

Skeleton: No acute osseous abnormality or suspicious osseous lesion.

Other neck: No evidence of cervical lymphadenopathy or mass.

Upper chest: Clear lung apices.

Review of the MIP images confirms the above findings

CTA HEAD FINDINGS

Anterior circulation: The internal carotid arteries are patent from
skull base crash that the internal carotid arteries are widely
patent from skull base to carotid termini. ACAs and MCAs are patent
with diffusely irregular appearance of the branch vessels felt to be
technical in nature and related to small vessel size and image
noise. No proximal branch occlusion or flow limiting A1 or M1
stenosis is evident. No aneurysm is identified.

Posterior circulation: The intracranial vertebral arteries are
widely patent to the basilar. Patent PICA and SCA origins are seen
bilaterally. The basilar artery is widely patent. Posterior
communicating arteries are diminutive or absent. Both PCAs are
patent without evidence of a flow limiting proximal stenosis. No
aneurysm is identified.

Venous sinuses: Patent.

Anatomic variants: None.

Review of the MIP images confirms the above findings
IMPRESSION: Negative head and neck CTA.
